# Patient Record
Sex: Female | Born: 1995 | Hispanic: Yes | Marital: Single | Smoking: Never smoker
Health system: Southern US, Community
[De-identification: ages and names within clinical notes are randomized; demographics above are authoritative.]

---

## 2021-05-26 ENCOUNTER — Encounter: Payer: Self-pay | Admitting: *Deleted

## 2021-05-26 DIAGNOSIS — Z20822 Contact with and (suspected) exposure to covid-19: Secondary | ICD-10-CM | POA: Diagnosis present

## 2021-05-26 DIAGNOSIS — Z23 Encounter for immunization: Secondary | ICD-10-CM

## 2021-05-26 DIAGNOSIS — L03114 Cellulitis of left upper limb: Principal | ICD-10-CM | POA: Diagnosis present

## 2021-05-26 DIAGNOSIS — M65132 Other infective (teno)synovitis, left wrist: Secondary | ICD-10-CM | POA: Diagnosis present

## 2021-05-26 DIAGNOSIS — D649 Anemia, unspecified: Secondary | ICD-10-CM | POA: Diagnosis present

## 2021-05-26 DIAGNOSIS — S61531A Puncture wound without foreign body of right wrist, initial encounter: Secondary | ICD-10-CM | POA: Diagnosis present

## 2021-05-26 DIAGNOSIS — S61552A Open bite of left wrist, initial encounter: Secondary | ICD-10-CM | POA: Diagnosis present

## 2021-05-26 DIAGNOSIS — E876 Hypokalemia: Secondary | ICD-10-CM | POA: Diagnosis present

## 2021-05-26 DIAGNOSIS — I959 Hypotension, unspecified: Secondary | ICD-10-CM | POA: Diagnosis present

## 2021-05-26 DIAGNOSIS — W5501XA Bitten by cat, initial encounter: Secondary | ICD-10-CM

## 2021-05-26 NOTE — ED Triage Notes (Signed)
Pt to ED reporting she was bitten by an unknown cat on Friday and is now having swelling and redness to the left hand. Pain with movement also reported.

## 2021-05-26 NOTE — ED Notes (Signed)
Redness on arm marked by this RN.

## 2021-05-27 ENCOUNTER — Other Ambulatory Visit: Payer: Self-pay

## 2021-05-27 ENCOUNTER — Inpatient Hospital Stay
Admission: EM | Admit: 2021-05-27 | Discharge: 2021-05-31 | DRG: 603 | Disposition: A | Discharge status: Home / Self Care | Payer: Self-pay | Attending: Internal Medicine | Admitting: Internal Medicine

## 2021-05-27 DIAGNOSIS — L03119 Cellulitis of unspecified part of limb: Secondary | ICD-10-CM

## 2021-05-27 DIAGNOSIS — L03114 Cellulitis of left upper limb: Secondary | ICD-10-CM | POA: Diagnosis present

## 2021-05-27 DIAGNOSIS — I959 Hypotension, unspecified: Secondary | ICD-10-CM

## 2021-05-27 DIAGNOSIS — M659 Synovitis and tenosynovitis, unspecified: Secondary | ICD-10-CM | POA: Diagnosis present

## 2021-05-27 DIAGNOSIS — M65932 Unspecified synovitis and tenosynovitis, left forearm: Secondary | ICD-10-CM | POA: Diagnosis present

## 2021-05-27 DIAGNOSIS — W5501XA Bitten by cat, initial encounter: Principal | ICD-10-CM

## 2021-05-27 DIAGNOSIS — S61452A Open bite of left hand, initial encounter: Secondary | ICD-10-CM | POA: Diagnosis present

## 2021-05-27 DIAGNOSIS — E876 Hypokalemia: Secondary | ICD-10-CM

## 2021-05-27 LAB — BASIC METABOLIC PANEL
Anion gap: 14 (ref 5–15)
BUN: 11 mg/dL (ref 6–20)
CO2: 25 mmol/L (ref 22–32)
Calcium: 8.6 mg/dL — ABNORMAL LOW (ref 8.9–10.3)
Chloride: 100 mmol/L (ref 98–111)
Creatinine, Ser: 0.47 mg/dL (ref 0.44–1.00)
GFR, Estimated: >60 mL/min (ref >60)
Glucose, Bld: 124 mg/dL — ABNORMAL HIGH (ref 70–99)
Potassium: 3 mmol/L — ABNORMAL LOW (ref 3.5–5.1)
Sodium: 139 mmol/L (ref 135–145)

## 2021-05-27 LAB — RESP PANEL BY RT-PCR (FLU A&B, COVID) ARPGX2
Influenza A by PCR: NEGATIVE
Influenza B by PCR: NEGATIVE
SARS Coronavirus 2 by RT PCR: NEGATIVE

## 2021-05-27 LAB — CBC WITH DIFFERENTIAL/PLATELET
Abs Immature Granulocytes: 0.02 10*3/uL (ref 0.00–0.07)
Basophils Absolute: 0 10*3/uL (ref 0.0–0.1)
Basophils Relative: 0 %
Eosinophils Absolute: 0.1 10*3/uL (ref 0.0–0.5)
Eosinophils Relative: 1 %
HCT: 31.7 % — ABNORMAL LOW (ref 36.0–46.0)
Hemoglobin: 11 g/dL — ABNORMAL LOW (ref 12.0–15.0)
Immature Granulocytes: 0 %
Lymphocytes Relative: 18 %
Lymphs Abs: 1.7 10*3/uL (ref 0.7–4.0)
MCH: 33.3 pg (ref 26.0–34.0)
MCHC: 34.7 g/dL (ref 30.0–36.0)
MCV: 96.1 fL (ref 80.0–100.0)
Monocytes Absolute: 0.5 10*3/uL (ref 0.1–1.0)
Monocytes Relative: 5 %
Neutro Abs: 7.2 10*3/uL (ref 1.7–7.7)
Neutrophils Relative %: 76 %
Platelets: 241 10*3/uL (ref 150–400)
RBC: 3.3 MIL/uL — ABNORMAL LOW (ref 3.87–5.11)
RDW: 13.2 % (ref 11.5–15.5)
WBC: 9.5 10*3/uL (ref 4.0–10.5)
nRBC: 0 % (ref 0.0–0.2)

## 2021-05-27 LAB — LACTIC ACID, PLASMA
Lactic Acid, Venous: 1.3 mmol/L (ref 0.5–1.9)
Lactic Acid, Venous: 1.1 mmol/L (ref 0.5–1.9)

## 2021-05-27 LAB — HIV ANTIBODY (ROUTINE TESTING W REFLEX): HIV Screen 4th Generation wRfx: NONREACTIVE

## 2021-05-27 LAB — HCG, QUANTITATIVE, PREGNANCY: hCG, Beta Chain, Quant, S: <1 m[IU]/mL (ref <5)

## 2021-05-27 LAB — MAGNESIUM: Magnesium: 2.1 mg/dL (ref 1.7–2.4)

## 2021-05-27 MED ORDER — POTASSIUM CHLORIDE CRYS ER 20 MEQ PO TBCR
40.0000 meq | EXTENDED_RELEASE_TABLET | Freq: Once | ORAL | Status: AC
  Administered 2021-05-27: 40 meq via ORAL
  Filled 2021-05-27: qty 2

## 2021-05-27 MED ORDER — TRAZODONE HCL 50 MG PO TABS
25.0000 mg | ORAL_TABLET | Freq: Every evening | ORAL | Status: DC | PRN
  Administered 2021-05-28 – 2021-05-30 (×3): 25 mg via ORAL
  Filled 2021-05-27 (×3): qty 1

## 2021-05-27 MED ORDER — VANCOMYCIN HCL 500 MG/100ML IV SOLN
500.0000 mg | Freq: Once | INTRAVENOUS | Status: AC
  Administered 2021-05-27: 500 mg via INTRAVENOUS
  Filled 2021-05-27: qty 100

## 2021-05-27 MED ORDER — SODIUM CHLORIDE 0.9 % IV SOLN: INTRAVENOUS | Status: DC

## 2021-05-27 MED ORDER — TETANUS-DIPHTH-ACELL PERTUSSIS 5-2.5-18.5 LF-MCG/0.5 IM SUSY
0.5000 mL | PREFILLED_SYRINGE | Freq: Once | INTRAMUSCULAR | Status: AC
  Administered 2021-05-27: 0.5 mL via INTRAMUSCULAR
  Filled 2021-05-27: qty 0.5

## 2021-05-27 MED ORDER — POTASSIUM CHLORIDE CRYS ER 20 MEQ PO TBCR: 40.0000 meq | EXTENDED_RELEASE_TABLET | Freq: Once | ORAL | Status: DC

## 2021-05-27 MED ORDER — ONDANSETRON HCL 4 MG PO TABS: 4.0000 mg | ORAL_TABLET | Freq: Four times a day (QID) | ORAL | Status: DC | PRN

## 2021-05-27 MED ORDER — OXYCODONE-ACETAMINOPHEN 5-325 MG PO TABS
1.0000 | ORAL_TABLET | Freq: Four times a day (QID) | ORAL | Status: DC | PRN
  Administered 2021-05-27 – 2021-05-29 (×4): 1 via ORAL
  Filled 2021-05-27 (×4): qty 1

## 2021-05-27 MED ORDER — SODIUM CHLORIDE 0.9 % IV SOLN
3.0000 g | Freq: Once | INTRAVENOUS | Status: AC
  Administered 2021-05-27: 3 g via INTRAVENOUS
  Filled 2021-05-27: qty 8

## 2021-05-27 MED ORDER — VANCOMYCIN HCL IN DEXTROSE 1-5 GM/200ML-% IV SOLN: 1000.0000 mg | Freq: Once | INTRAVENOUS | Status: DC

## 2021-05-27 MED ORDER — FENTANYL CITRATE PF 50 MCG/ML IJ SOSY
50.0000 ug | PREFILLED_SYRINGE | Freq: Once | INTRAMUSCULAR | Status: AC
  Administered 2021-05-27: 50 ug via INTRAVENOUS
  Filled 2021-05-27: qty 1

## 2021-05-27 MED ORDER — ENOXAPARIN SODIUM 40 MG/0.4ML IJ SOSY
40.0000 mg | PREFILLED_SYRINGE | INTRAMUSCULAR | Status: DC
  Administered 2021-05-28 – 2021-05-31 (×4): 40 mg via SUBCUTANEOUS
  Filled 2021-05-27 (×4): qty 0.4

## 2021-05-27 MED ORDER — ONDANSETRON HCL 4 MG/2ML IJ SOLN: 4.0000 mg | Freq: Four times a day (QID) | INTRAMUSCULAR | Status: DC | PRN

## 2021-05-27 MED ORDER — VANCOMYCIN HCL 750 MG/150ML IV SOLN
750.0000 mg | Freq: Two times a day (BID) | INTRAVENOUS | Status: DC
Start: 2021-05-27 — End: 2021-05-28
  Administered 2021-05-27: 22:00:00 750 mg via INTRAVENOUS
  Filled 2021-05-27 (×2): qty 150

## 2021-05-27 MED ORDER — VANCOMYCIN HCL IN DEXTROSE 1-5 GM/200ML-% IV SOLN
1000.0000 mg | Freq: Once | INTRAVENOUS | Status: AC
  Administered 2021-05-27: 1000 mg via INTRAVENOUS
  Filled 2021-05-27: qty 200

## 2021-05-27 MED ORDER — MAGNESIUM HYDROXIDE 400 MG/5ML PO SUSP
30.0000 mL | Freq: Every day | ORAL | Status: DC | PRN
  Filled 2021-05-27: qty 30

## 2021-05-27 MED ORDER — SODIUM CHLORIDE 0.9 % IV SOLN
3.0000 g | Freq: Four times a day (QID) | INTRAVENOUS | Status: DC
  Administered 2021-05-27 – 2021-05-31 (×17): 3 g via INTRAVENOUS
  Filled 2021-05-27 (×2): qty 3
  Filled 2021-05-27: qty 8
  Filled 2021-05-27: qty 3
  Filled 2021-05-27: qty 8
  Filled 2021-05-27 (×2): qty 3
  Filled 2021-05-27: qty 8
  Filled 2021-05-27: qty 3
  Filled 2021-05-27 (×3): qty 8
  Filled 2021-05-27: qty 3
  Filled 2021-05-27 (×2): qty 8
  Filled 2021-05-27 (×5): qty 3
  Filled 2021-05-27 (×2): qty 8

## 2021-05-27 MED ORDER — ACETAMINOPHEN 650 MG RE SUPP: 650.0000 mg | Freq: Four times a day (QID) | RECTAL | Status: DC | PRN

## 2021-05-27 MED ORDER — KETOROLAC TROMETHAMINE 15 MG/ML IJ SOLN
15.0000 mg | Freq: Four times a day (QID) | INTRAMUSCULAR | Status: DC | PRN
  Administered 2021-05-27 – 2021-05-30 (×8): 15 mg via INTRAVENOUS
  Filled 2021-05-27 (×12): qty 1

## 2021-05-27 MED ORDER — MORPHINE SULFATE (PF) 2 MG/ML IV SOLN
1.0000 mg | INTRAVENOUS | Status: DC | PRN
  Administered 2021-05-29: 1 mg via INTRAVENOUS
  Filled 2021-05-27: qty 1

## 2021-05-27 MED ORDER — RABIES IMMUNE GLOBULIN 150 UNIT/ML IM INJ
20.0000 [IU]/kg | INJECTION | Freq: Once | INTRAMUSCULAR | Status: AC
  Administered 2021-05-27: 1350 [IU] via INTRAMUSCULAR
  Filled 2021-05-27: qty 9

## 2021-05-27 MED ORDER — RABIES IMMUNE GLOBULIN 150 UNIT/ML IM INJ
20.0000 [IU]/kg | INJECTION | Freq: Once | INTRAMUSCULAR | Status: DC
  Filled 2021-05-27: qty 9

## 2021-05-27 MED ORDER — ACETAMINOPHEN 325 MG PO TABS
650.0000 mg | ORAL_TABLET | Freq: Four times a day (QID) | ORAL | Status: DC | PRN
  Administered 2021-05-27: 650 mg via ORAL
  Filled 2021-05-27: qty 2

## 2021-05-27 MED ORDER — KETOROLAC TROMETHAMINE 30 MG/ML IJ SOLN
15.0000 mg | Freq: Once | INTRAMUSCULAR | Status: AC
  Administered 2021-05-27: 15 mg via INTRAVENOUS
  Filled 2021-05-27: qty 1

## 2021-05-27 MED ORDER — RABIES VACCINE, PCEC IM SUSR
1.0000 mL | Freq: Once | INTRAMUSCULAR | Status: AC
  Administered 2021-05-27: 1 mL via INTRAMUSCULAR
  Filled 2021-05-27: qty 1

## 2021-05-27 NOTE — Progress Notes (Signed)
Pharmacy Antibiotic Note  Deborah Hurley is a 25 y.o. female admitted on 05/27/2021 with an animal bite. Patient was bitten by a stray cat. Has swelling, redness and pain to left hand and extending to the arm.  Pharmacy has been consulted for vancomycin dosing.  Plan: Vancomycin 1000 mg given, add 500 mg for 1500 mg total loading dose Start vancomycin 750 mg IV q12h Goal AUC 400-550 Expected AUC: 427 SCr used: 0.8   Height: 5\' 1"  (154.9 cm) Weight: 68 kg (150 lb) IBW/kg (Calculated) : 47.8  Temp (24hrs), Avg:99.3 F (37.4 C), Min:99.1 F (37.3 C), Max:99.4 F (37.4 C)  Recent Labs  Lab 05/27/21 0323 05/27/21 0535  WBC 9.5  --   CREATININE 0.47  --   LATICACIDVEN 1.3 1.1    Estimated Creatinine Clearance: 94.9 mL/min (by C-G formula based on SCr of 0.47 mg/dL).    No Known Allergies  Antimicrobials this admission: Vancomycin 10/24 >> Unasyn 10/24 >>   Microbiology results: 10/24 BCx: NG pending   Thank you for allowing pharmacy to be a part of this patient's care.  11/24, PharmD, BCPS Clinical Pharmacist 05/27/2021 11:25 AM

## 2021-05-27 NOTE — H&P (Signed)
Deborah Hurley   PATIENT NAME: Deborah Hurley    MR#:  [id number]  DATE OF BIRTH:  18-Nov-1995  DATE OF ADMISSION:  05/27/2021  PRIMARY CARE PHYSICIAN: Pcp, No   Patient is coming from: Home  REQUESTING/REFERRING PHYSICIAN: Nita Sickle, MD.  CHIEF COMPLAINT:   Chief Complaint  Patient presents with   Animal Bite    HISTORY OF PRESENT ILLNESS:  Deborah Hurley is a 25 y.o. female with no significant medical history coming with worsening left hand and distal forearm swelling with erythema, tenderness and pain after having a bite by a stray cat on Friday.  She stated that her swelling started on Saturday and since then has been getting significantly worse.  No drainage from her hand or forearm.  No fever or chills.  She denies any chest pain or dyspnea or palpitations.  The cath was apparently not caught. ED Course: When she came to the ER vital signs were within normal except for temperature of 99.1 and later 99.4.  Blood pressure later on was 99/57.  Labs were remarkable for hypokalemia of 3 and a calcium of 8.6, hemoglobin of 11 hematocrit 31.7.  Influenza antigens and COVID-19 PCR came back negative.  Serum pregnancy test came back negative.  Blood cultures were drawn.  Patient was given 50 mcg of IV fentanyl, 40 mEq p.o. potassium chloride, IV Unasyn and vancomycin, 50 mg of IV Toradol, rabies vaccine as well as rabies immunoglobulin.  She will be admitted to a medical bed for further evaluation and management. PAST MEDICAL HISTORY:   She denies any previous medical problems.  PAST SURGICAL HISTORY:   She denies any previous surgeries.  SOCIAL HISTORY:   Social History   Tobacco Use   Smoking status: Never   Smokeless tobacco: Never  Substance Use Topics   Alcohol use: Not on file  She drinks alcohol occasionally but denies any history of tobacco abuse or other illicit drug use.  FAMILY HISTORY:  She denies any familial diseases.  DRUG ALLERGIES:  No  Known Allergies  REVIEW OF SYSTEMS:   ROS As per history of present illness. All pertinent systems were reviewed above. Constitutional, HEENT, cardiovascular, respiratory, GI, GU, musculoskeletal, neuro, psychiatric, endocrine, integumentary and hematologic systems were reviewed and are otherwise negative/unremarkable except for positive findings mentioned above in the HPI.   MEDICATIONS AT HOME:   Prior to Admission medications   Not on File      VITAL SIGNS:  Blood pressure 101/60, pulse 61, temperature 99.4 F (37.4 C), temperature source Oral, resp. rate 18, height 5\' 1"  (1.549 m), weight 68 kg, last menstrual period 05/26/2021, SpO2 99 %.  PHYSICAL EXAMINATION:  Physical Exam  GENERAL:  25 y.o.-year-old female patient lying in the bed with no acute distress.  EYES: Pupils equal, round, reactive to light and accommodation. No scleral icterus. Extraocular muscles intact.  HEENT: Head atraumatic, normocephalic. Oropharynx and nasopharynx clear.  NECK:  Supple, no jugular venous distention. No thyroid enlargement, no tenderness.  LUNGS: Normal breath sounds bilaterally, no wheezing, rales,rhonchi or crepitation. No use of accessory muscles of respiration.  CARDIOVASCULAR: Regular rate and rhythm, S1, S2 normal. No murmurs, rubs, or gallops.  ABDOMEN: Soft, nondistended, nontender. Bowel sounds present. No organomegaly or mass.  EXTREMITIES: No pedal edema, cyanosis, or clubbing.  NEUROLOGIC: Cranial nerves II through XII are intact. Muscle strength 5/5 in all extremities. Sensation intact. Gait not checked.  PSYCHIATRIC: The patient is alert and oriented x 3.  Normal  affect and good eye contact. SKIN: Diffuse erythema and swelling of the left hand and distal forearm with no fluctuation however with associated diffuse tenderness.   LABORATORY PANEL:   CBC Recent Labs  Lab 05/27/21 0323  WBC 9.5  HGB 11.0*  HCT 31.7*  PLT 241    ------------------------------------------------------------------------------------------------------------------  Chemistries  Recent Labs  Lab 05/27/21 0323  NA 139  K 3.0*  CL 100  CO2 25  GLUCOSE 124*  BUN 11  CREATININE 0.47  CALCIUM 8.6*  MG 2.1   ------------------------------------------------------------------------------------------------------------------  Cardiac Enzymes No results for input(s): TROPONINI in the last 168 hours. ------------------------------------------------------------------------------------------------------------------  RADIOLOGY:  No results found.    IMPRESSION AND PLAN:  Active Problems:   Cellulitis of left hand  1.  Diffuse left hand and distal forearm cellulitis due to infected stray cat bite. - The patient will be admitted to a medical bed. - Continue antibiotic therapy with IV Unasyn to cover anaerobes and IV vancomycin given the severity of the cellulitis. - Warm compresses will be given - We will obtain general surgery consult for follow-up. - Pain management will be provided. - We will follow blood cultures. - The patient received rabies vaccine and immunoglobulin.  He will need to complete series for rabies vaccine.  Follow-up can be given to the patient with [facility] urgent care or at the local health department ER.  2.  Hypokalemia. - Potassium will be replaced and magnesium level will be checked.  3.  Hypotension. - The patient has been responding to IV fluids and BP will be closely monitored.   DVT prophylaxis: Lovenox. Code Status: full code. Family Communication:  The plan of care was discussed in details with the patient (and family). I answered all questions. The patient agreed to proceed with the above mentioned plan. Further management will depend upon hospital course. Disposition Plan: Back to previous home environment Consults called: General surgery. All the records are reviewed and case discussed  with ED provider.  Status is: Inpatient  Remains inpatient appropriate because:Ongoing diagnostic testing needed not appropriate for outpatient work up, Unsafe d/c plan, IV treatments appropriate due to intensity of illness or inability to take PO, and Inpatient level of care appropriate due to severity of illness   Dispo: The patient is from: Home              Anticipated d/c is to: Home              Patient currently is not medically stable to d/c.              Difficult to place patient: No  TOTAL TIME TAKING CARE OF THIS PATIENT: 50 minutes.     Hannah Beat M.D on 05/27/2021 at 7:27 AM  [facility]   From 7 PM-7 AM, contact night-coverage [email or web address]  CC: Primary care physician; Pcp, No

## 2021-05-27 NOTE — Progress Notes (Signed)
PROGRESS NOTE    Deborah Hurley  ATF:[id number] DOB: 09-16-1995 DOA: 05/27/2021 PCP: Pcp, No   Assessment & Plan:   Active Problems:   Cellulitis of left hand   Cellulitis of left hand & forearm: due to infected stray cat bite. S/p rabies vaccine & immunoglobulin given in the ER and pt will need to complete rabies vaccine series. Continue on IV unasyn, vanco. Blood cxs NGTD.  Ortho surg consulted   Hypokalemia: KCl repleated. Will continue to monitor   Hypotension: resolved    DVT prophylaxis: lovenox Code Status: full  Family Communication:  Disposition Plan: likely d/c back home   Level of care: Med-Surg  Status is: Inpatient  Remains inpatient appropriate because: severity of illness    Consultants:  Ortho surg   Procedures:   Antimicrobials: vanco, unasyn   Subjective: Pt c/o left hand pain & swelling   Objective: Vitals:   05/27/21 0400 05/27/21 0544 05/27/21 0600 05/27/21 0744  BP: 118/76 (!) 99/57 101/60 104/63  Pulse: 88 77 61 60  Resp: 18 16 18 16   Temp:      TempSrc:      SpO2: 99% 100% 99% 96%  Weight:      Height:       No intake or output data in the 24 hours ending 05/27/21 0841 Filed Weights   05/26/21 2106  Weight: 68 kg    Examination:  General exam: Appears calm and comfortable  Respiratory system: Clear to auscultation. Respiratory effort normal. Cardiovascular system: S1 & S2 +. No rubs, gallops or clicks. No pedal edema. Gastrointestinal system: Abdomen is nondistended, soft and nontender. Normal bowel sounds heard. Central nervous system: Alert and oriented Extremities: left hand and arm are edematous, erythematous, & tender to palpation Psychiatry: Judgement and insight appear normal. Mood & affect appropriate.     Data Reviewed: I have personally reviewed following labs and imaging studies  CBC: Recent Labs  Lab 05/27/21 0323  WBC 9.5  NEUTROABS 7.2  HGB 11.0*  HCT 31.7*  MCV 96.1  PLT 241   Basic  Metabolic Panel: Recent Labs  Lab 05/27/21 0323  NA 139  K 3.0*  CL 100  CO2 25  GLUCOSE 124*  BUN 11  CREATININE 0.47  CALCIUM 8.6*  MG 2.1   GFR: Estimated Creatinine Clearance: 94.9 mL/min (by C-G formula based on SCr of 0.47 mg/dL). Liver Function Tests: No results for input(s): AST, ALT, ALKPHOS, BILITOT, PROT, ALBUMIN in the last 168 hours. No results for input(s): LIPASE, AMYLASE in the last 168 hours. No results for input(s): AMMONIA in the last 168 hours. Coagulation Profile: No results for input(s): INR, PROTIME in the last 168 hours. Cardiac Enzymes: No results for input(s): CKTOTAL, CKMB, CKMBINDEX, TROPONINI in the last 168 hours. BNP (last 3 results) No results for input(s): PROBNP in the last 8760 hours. HbA1C: No results for input(s): HGBA1C in the last 72 hours. CBG: No results for input(s): GLUCAP in the last 168 hours. Lipid Profile: No results for input(s): CHOL, HDL, LDLCALC, TRIG, CHOLHDL, LDLDIRECT in the last 72 hours. Thyroid Function Tests: No results for input(s): TSH, T4TOTAL, FREET4, T3FREE, THYROIDAB in the last 72 hours. Anemia Panel: No results for input(s): VITAMINB12, FOLATE, FERRITIN, TIBC, IRON, RETICCTPCT in the last 72 hours. Sepsis Labs: Recent Labs  Lab 05/27/21 0323 05/27/21 0535  LATICACIDVEN 1.3 1.1    Recent Results (from the past 240 hour(s))  Blood culture (routine x 2)     Status: None (Preliminary result)  Collection Time: 05/27/21  3:13 AM   Specimen: BLOOD  Result Value Ref Range Status   Specimen Description BLOOD RIGHT ANTECUBITAL  Final   Special Requests   Final    BOTTLES DRAWN AEROBIC AND ANAEROBIC Blood Culture adequate volume   Culture   Final    NO GROWTH < 12 HOURS Performed at [facility], [address], [place], [place] [address]    Report Status PENDING  Incomplete  Blood culture (routine x 2)     Status: None (Preliminary result)   Collection Time: 05/27/21  3:19 AM   Specimen:  BLOOD  Result Value Ref Range Status   Specimen Description BLOOD BLOOD RIGHT HAND  Final   Special Requests   Final    BOTTLES DRAWN AEROBIC AND ANAEROBIC Blood Culture adequate volume   Culture   Final    NO GROWTH < 12 HOURS Performed at [facility], [address], [place], [place] [address]    Report Status PENDING  Incomplete  Resp Panel by RT-PCR (Flu A&B, Covid) Nasopharyngeal Swab     Status: None   Collection Time: 05/27/21  3:45 AM   Specimen: Nasopharyngeal Swab; Nasopharyngeal(NP) swabs in vial transport medium  Result Value Ref Range Status   SARS Coronavirus 2 by RT PCR NEGATIVE NEGATIVE Final    Comment: (NOTE) SARS-CoV-2 target nucleic acids are NOT DETECTED.  The SARS-CoV-2 RNA is generally detectable in upper respiratory specimens during the acute phase of infection. The lowest concentration of SARS-CoV-2 viral copies this assay can detect is 138 copies/mL. A negative result does not preclude SARS-Cov-2 infection and should not be used as the sole basis for treatment or other patient management decisions. A negative result may occur with  improper specimen collection/handling, submission of specimen other than nasopharyngeal swab, presence of viral mutation(s) within the areas targeted by this assay, and inadequate number of viral copies(<138 copies/mL). A negative result must be combined with clinical observations, patient history, and epidemiological information. The expected result is Negative.  Fact Sheet for Patients:  BloggerCourse.com  Fact Sheet for Healthcare Providers:  SeriousBroker.it  This test is no t yet approved or cleared by the [place] FDA and  has been authorized for detection and/or diagnosis of SARS-CoV-2 by FDA under an Emergency Use Authorization (EUA). This EUA will remain  in effect (meaning this test can be used) for the duration of the COVID-19 declaration under  Section 564(b)(1) of the Act, 21 U.S.C.section 360bbb-3(b)(1), unless the authorization is terminated  or revoked sooner.       Influenza A by PCR NEGATIVE NEGATIVE Final   Influenza B by PCR NEGATIVE NEGATIVE Final    Comment: (NOTE) The Xpert Xpress SARS-CoV-2/FLU/RSV plus assay is intended as an aid in the diagnosis of influenza from Nasopharyngeal swab specimens and should not be used as a sole basis for treatment. Nasal washings and aspirates are unacceptable for Xpert Xpress SARS-CoV-2/FLU/RSV testing.  Fact Sheet for Patients: BloggerCourse.com  Fact Sheet for Healthcare Providers: SeriousBroker.it  This test is not yet approved or cleared by the [place] FDA and has been authorized for detection and/or diagnosis of SARS-CoV-2 by FDA under an Emergency Use Authorization (EUA). This EUA will remain in effect (meaning this test can be used) for the duration of the COVID-19 declaration under Section 564(b)(1) of the Act, 21 U.S.C. section 360bbb-3(b)(1), unless the authorization is terminated or revoked.  Performed at [facility], [address] [place], [place] [address]  Radiology Studies: No results found.      Scheduled Meds:  enoxaparin (LOVENOX) injection  40 mg Subcutaneous Q24H   potassium chloride  40 mEq Oral Once   potassium chloride  40 mEq Oral Once   Continuous Infusions:  sodium chloride 100 mL/hr at 05/27/21 0617   ampicillin-sulbactam (UNASYN) IV 3 g (05/27/21 0809)   vancomycin     vancomycin       LOS: 0 days    Time spent: 32 mins     Charise Killian, MD [facility] Pager [phone]  If 7PM-7AM, please contact night-coverage 05/27/2021, 8:41 AM

## 2021-05-27 NOTE — ED Notes (Signed)
Not enough blood return to completely fill blood culture bottles.

## 2021-05-27 NOTE — ED Notes (Signed)
Pt ambulated to toilet. Then back to bed.  

## 2021-05-27 NOTE — ED Notes (Signed)
RN to bedside to introduce self to pt. Pt awake and in no distress. Pt asking for pain medication.

## 2021-05-27 NOTE — ED Notes (Signed)
Informed RN bed assigned 

## 2021-05-27 NOTE — ED Notes (Signed)
Pt ambulated to toilet and back.  

## 2021-05-27 NOTE — ED Provider Notes (Signed)
[facility] Emergency Department Provider Note  ____________________________________________  Time seen: Approximately 3:01 AM  I have reviewed the triage vital signs and the nursing notes.   HISTORY  Chief Complaint Animal Bite   HPI Deborah Hurley is a 25 y.o. female no significant past medical history who presents for evaluation of left hand pain.  Patient reports that she was bitten by a stray cat 2 days ago.  Today started having swelling, redness, and pain of the left hand that is extending to the arm.  Denies fever or chills.  She is complaining of severe sharp pain worse with movement of the hand has been constant since earlier today.  Unknown last tetanus shot.  No prior rabies immunization.   History reviewed. No pertinent past medical history.  There are no problems to display for this patient.   History reviewed. No pertinent surgical history.  Prior to Admission medications   Not on File    Allergies Patient has no allergy information on record.  History reviewed. No pertinent family history.  Social History Social History   Tobacco Use   Smoking status: Never   Smokeless tobacco: Never    Review of Systems  Constitutional: Negative for fever. Eyes: Negative for visual changes. ENT: Negative for sore throat. Neck: No neck pain  Cardiovascular: Negative for chest pain. Respiratory: Negative for shortness of breath. Gastrointestinal: Negative for abdominal pain, vomiting or diarrhea. Genitourinary: Negative for dysuria. Musculoskeletal: Negative for back pain. + L hand pain and swelling Skin: Negative for rash. Neurological: Negative for headaches, weakness or numbness. Psych: No SI or HI  ____________________________________________   PHYSICAL EXAM:  VITAL SIGNS: ED Triage Vitals  Enc Vitals Group     BP 05/26/21 2106 116/70     Pulse Rate 05/26/21 2106 88     Resp 05/26/21 2106 18     Temp 05/26/21 2106 99.1  F (37.3 C)     Temp Source 05/26/21 2106 Oral     SpO2 05/26/21 2106 100 %     Weight 05/26/21 2106 150 lb (68 kg)     Height 05/26/21 2106 5\' 1"  (1.549 m)     Head Circumference --      Peak Flow --      Pain Score 05/26/21 2110 9     Pain Loc --      Pain Edu? --      Excl. in GC? --     Constitutional: Alert and oriented. Well appearing and in no apparent distress. HEENT:      Head: Normocephalic and atraumatic.         Eyes: Conjunctivae are normal. Sclera is non-icteric.       Mouth/Throat: Mucous membranes are moist.       Neck: Supple with no signs of meningismus. Cardiovascular: Regular rate and rhythm. No murmurs, gallops, or rubs. 2+ symmetrical distal pulses are present in all extremities. No JVD. Respiratory: Normal respiratory effort. Lungs are clear to auscultation bilaterally.  Gastrointestinal: Soft, non tender, and non distended with positive bowel sounds. No rebound or guarding. Musculoskeletal: There are several shallow healing scratches that look like from cat bite.  The left hand has circumferential swelling, erythema, warmth, diffuse tenderness extending all the way to distal forearm Neurologic: Normal speech and language. Face is symmetric. Moving all extremities. No gross focal neurologic deficits are appreciated. Skin: Skin is warm, dry and intact. No rash noted. Psychiatric: Mood and affect are normal. Speech and behavior are normal.  ____________________________________________   LABS (all labs ordered are listed, but only abnormal results are displayed)  Labs Reviewed  CBC WITH DIFFERENTIAL/PLATELET - Abnormal; Notable for the following components:      Result Value   RBC 3.30 (*)    Hemoglobin 11.0 (*)    HCT 31.7 (*)    All other components within normal limits  BASIC METABOLIC PANEL - Abnormal; Notable for the following components:   Potassium 3.0 (*)    Glucose, Bld 124 (*)    Calcium 8.6 (*)    All other components within normal limits   CULTURE, BLOOD (ROUTINE X 2)  CULTURE, BLOOD (ROUTINE X 2)  RESP PANEL BY RT-PCR (FLU A&B, COVID) ARPGX2  LACTIC ACID, PLASMA  LACTIC ACID, PLASMA  HCG, QUANTITATIVE, PREGNANCY   ____________________________________________  EKG  none  ____________________________________________  RADIOLOGY  none   ____________________________________________   PROCEDURES  Procedure(s) performed: None Procedures   Critical Care performed:  None ____________________________________________   INITIAL IMPRESSION / ASSESSMENT AND PLAN / ED COURSE  25 y.o. female no significant past medical history who presents for evaluation of left hand pain and swelling after being bitten by a stray cat 3 days ago.  Patient with severe circumferential swelling, erythema and warmth of the left hand and wrist extending to the distal left forearm with several healing scratches/cat bite wounds.  Unknown last tetanus shot, no prior rabies immunization.  Will initiate IV Unasyn and vancomycin, will get blood work, will give tetanus booster, rabies vaccine and immunoglobulin's.  Anticipate admission to the hospitalist service for IV antibiotics and close monitoring.  _________________________ 3:53 AM on 05/27/2021 ----------------------------------------- Labs showing no signs of sepsis.  We will discussed with the hospitalist for admission.     _____________________________________________ Please note:  Patient was evaluated in Emergency Department today for the symptoms described in the history of present illness. Patient was evaluated in the context of the global COVID-19 pandemic, which necessitated consideration that the patient might be at risk for infection with the SARS-CoV-2 virus that causes COVID-19. Institutional protocols and algorithms that pertain to the evaluation of patients at risk for COVID-19 are in a state of rapid change based on information released by regulatory bodies including the CDC and  federal and state organizations. These policies and algorithms were followed during the patient's care in the ED.  Some ED evaluations and interventions may be delayed as a result of limited staffing during the pandemic.   [place] Controlled Substance Database was reviewed by me. ____________________________________________   FINAL CLINICAL IMPRESSION(S) / ED DIAGNOSES   Final diagnoses:  Cat bite of left hand, initial encounter  Cellulitis of hand      NEW MEDICATIONS STARTED DURING THIS VISIT:  ED Discharge Orders     None        Note:  This document was prepared using Dragon voice recognition software and may include unintentional dictation errors.    Nita Sickle, MD 05/27/21 [phone]

## 2021-05-27 NOTE — Plan of Care (Signed)
Education started 

## 2021-05-27 NOTE — Consult Note (Signed)
ORTHOPAEDIC CONSULTATION  REQUESTING PHYSICIAN: Charise Killian, MD  Chief Complaint:   Left hand pain and swelling  History of Present Illness: Deborah Hurley is a 25 y.o. female who is right-handed and was bitten by a stray cat on 05/24/2021.  She noted 4 puncture wounds along the radial aspect of her distal forearm just proximal to the wrist.  She has had increased pain and swelling over the weekend and presented to the emergency department given these findings.  She was started on IV antibiotics consisting of Unasyn and vancomycin and was given Appropriate treatment for rabies.  She denies any drainage, fevers, chills.  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  No significant medical history.  History reviewed. No pertinent past medical history. History reviewed. No pertinent surgical history. Social History   Socioeconomic History   Marital status: Single    Spouse name: Not on file   Number of children: Not on file   Years of education: Not on file   Highest education level: Not on file  Occupational History   Not on file  Tobacco Use   Smoking status: Never   Smokeless tobacco: Never  Substance and Sexual Activity   Alcohol use: Not on file   Drug use: Not on file   Sexual activity: Not on file  Other Topics Concern   Not on file  Social History Narrative   Not on file   Social Determinants of Health   Financial Resource Strain: Not on file  Food Insecurity: Not on file  Transportation Needs: Not on file  Physical Activity: Not on file  Stress: Not on file  Social Connections: Not on file   History reviewed. No pertinent family history. No Known Allergies Prior to Admission medications   Not on File   Recent Labs    05/27/21 0323  WBC 9.5  HGB 11.0*  HCT 31.7*  PLT 241  K 3.0*  CL 100  CO2 25  BUN 11  CREATININE 0.47  GLUCOSE 124*  CALCIUM 8.6*   No results  found.   Positive ROS: All other systems have been reviewed and were otherwise negative with the exception of those mentioned in the HPI and as above.  Physical Exam: BP (!) 100/59   Pulse 65   Temp 99.4 F (37.4 C) (Oral)   Resp 16   Ht 5\' 1"  (1.549 m)   Wt 68 kg   LMP 05/26/2021 (Exact Date)   SpO2 98%   BMI 28.34 kg/m  General:  Alert, no acute distress Psychiatric:  Patient is competent for consent with normal mood and affect   Cardiovascular:  No pedal edema, regular rate and rhythm Respiratory:  No wheezing, non-labored breathing GI:  Abdomen is soft and non-tender Skin:  No lesions in the area of chief complaint, no erythema Neurologic:  Sensation intact distally, CN grossly intact Lymphatic:  No axillary or cervical lymphadenopathy  Orthopedic Exam:  LUE: +ain/pin/u motor.  Patient has active and passive range of motion of the wrist and fingers with moderate pain. SILT r/u/m/ax +rad pulse Significant swelling about the dorsum of the hand.  There is significant erythema about the dorsum of the hand and wrist with extension to the distal forearm.   There is no palpable fluid collection. There is mild-moderate tenderness to palpation, most significant in the region of the puncture wounds.  No active drainage from these sites.   X-rays:  None obtained   Assessment/Plan: 25 year old female with soft tissue swelling, erythema,  and pain after cat bite consistent with cellulitis.  There is not appear to be any focal fluid collection or other condition amenable to surgical treatment at this time. 1.  Recommend continued IV antibiotics including Unasyn to cover for organisms transmitted via cat bite.  Can continue vancomycin as well. 2.  Keep hand elevated and immobilized as much as possible. 3.  We will plan to reevaluate over the next few days.  Can consider MRI of the hand if no significant improvement or worsening of symptoms.    Signa Kell   05/27/2021 11:23 AM

## 2021-05-28 DIAGNOSIS — S61452A Open bite of left hand, initial encounter: Secondary | ICD-10-CM | POA: Diagnosis present

## 2021-05-28 DIAGNOSIS — W5501XA Bitten by cat, initial encounter: Secondary | ICD-10-CM

## 2021-05-28 LAB — CBC
HCT: 29.3 % — ABNORMAL LOW (ref 36.0–46.0)
Hemoglobin: 9.7 g/dL — ABNORMAL LOW (ref 12.0–15.0)
MCH: 31.6 pg (ref 26.0–34.0)
MCHC: 33.1 g/dL (ref 30.0–36.0)
MCV: 95.4 fL (ref 80.0–100.0)
Platelets: 203 10*3/uL (ref 150–400)
RBC: 3.07 MIL/uL — ABNORMAL LOW (ref 3.87–5.11)
RDW: 13.2 % (ref 11.5–15.5)
WBC: 6.8 10*3/uL (ref 4.0–10.5)
nRBC: 0 % (ref 0.0–0.2)

## 2021-05-28 LAB — BASIC METABOLIC PANEL
Anion gap: 6 (ref 5–15)
BUN: 7 mg/dL (ref 6–20)
CO2: 22 mmol/L (ref 22–32)
Calcium: 8.4 mg/dL — ABNORMAL LOW (ref 8.9–10.3)
Chloride: 108 mmol/L (ref 98–111)
Creatinine, Ser: 0.44 mg/dL (ref 0.44–1.00)
GFR, Estimated: >60 mL/min (ref >60)
Glucose, Bld: 95 mg/dL (ref 70–99)
Potassium: 4 mmol/L (ref 3.5–5.1)
Sodium: 136 mmol/L (ref 135–145)

## 2021-05-28 LAB — MAGNESIUM: Magnesium: 2 mg/dL (ref 1.7–2.4)

## 2021-05-28 MED ORDER — VANCOMYCIN HCL IN DEXTROSE 1-5 GM/200ML-% IV SOLN
1000.0000 mg | Freq: Two times a day (BID) | INTRAVENOUS | Status: DC
Start: 2021-05-28 — End: 2021-05-29
  Administered 2021-05-28 – 2021-05-29 (×3): 1000 mg via INTRAVENOUS
  Filled 2021-05-28 (×5): qty 200

## 2021-05-28 NOTE — Progress Notes (Signed)
Pharmacy Antibiotic Note  Deborah Hurley is a 25 y.o. female admitted on 05/27/2021 with an animal bite. Patient was bitten by a stray cat. Has swelling, redness and pain to left hand and extending to the arm.  Pharmacy has been consulted for vancomycin dosing. Pt received 1500 mg total loading dose.   Plan: Change vancomycin 750 mg IV q12h to 1000 mg IV q12h Goal AUC 400-550 Expected AUC: 448 SCr used: 0.8; Vd 0.72, IBW   Height: 5\' 1"  (154.9 cm) Weight: 68 kg (150 lb) IBW/kg (Calculated) : 47.8  Temp (24hrs), Avg:98.2 F (36.8 C), Min:97.7 F (36.5 C), Max:98.8 F (37.1 C)  Recent Labs  Lab 05/27/21 0323 05/27/21 0535 05/28/21 0607  WBC 9.5  --  6.8  CREATININE 0.47  --  0.44  LATICACIDVEN 1.3 1.1  --      Estimated Creatinine Clearance: 94.9 mL/min (by C-G formula based on SCr of 0.44 mg/dL).    No Known Allergies  Antimicrobials this admission: Vancomycin 10/24 >> Unasyn 10/24 >>   Microbiology results: 10/24 BCx: NG<12h   Thank you for allowing pharmacy to be a part of this patient's care.  11/24, PharmD Clinical Pharmacist 05/28/2021 7:20 AM

## 2021-05-28 NOTE — Progress Notes (Signed)
Patient states that her hand pain may be slightly improved, but it is still significantly painful.  She has been keeping her hand elevated most of the time.  LUE: +ain/pin/u motor.  Patient has active and passive range of motion of the wrist and fingers.  Exam improved with more passive motion compared to that on 05/27/2021 SILT r/u/m/ax +rad pulse Significant swelling about the dorsum of the hand. Prior erythema has resolved. There is no palpable fluid collection. There is mild tenderness to palpation, most significant in the region of the puncture wounds.  No active drainage from these sites.     Assessment/Plan: 25 year old female with soft tissue swelling, erythema, and pain after cat bite consistent with cellulitis.  There is not appear to be any focal fluid collection or other condition amenable to surgical treatment at this time. 1.  Recommend continued IV antibiotics including Unasyn to cover for organisms transmitted via cat bite.  Can continue vancomycin as well. 2.  Keep hand elevated and immobilized as much as possible. 3.  We will plan to reevaluate over the next few days.  Can consider MRI of the hand if no significant improvement or worsening of symptoms.

## 2021-05-28 NOTE — Progress Notes (Signed)
PROGRESS NOTE   HPI was taken from Dr. Arville Care: Deborah Hurley is a 25 y.o. female with no significant medical history coming with worsening left hand and distal forearm swelling with erythema, tenderness and pain after having a bite by a stray cat on Friday.  She stated that her swelling started on Saturday and since then has been getting significantly worse.  No drainage from her hand or forearm.  No fever or chills.  She denies any chest pain or dyspnea or palpitations.  The cath was apparently not caught. ED Course: When she came to the ER vital signs were within normal except for temperature of 99.1 and later 99.4.  Blood pressure later on was 99/57.  Labs were remarkable for hypokalemia of 3 and a calcium of 8.6, hemoglobin of 11 hematocrit 31.7.  Influenza antigens and COVID-19 PCR came back negative.  Serum pregnancy test came back negative.  Blood cultures were drawn.   Patient was given 50 mcg of IV fentanyl, 40 mEq p.o. potassium chloride, IV Unasyn and vancomycin, 50 mg of IV Toradol, rabies vaccine as well as rabies immunoglobulin.  She will be admitted to a medical bed for further evaluation and management.   Deborah Hurley  CVE:[id number] DOB: May 25, 1996 DOA: 05/27/2021 PCP: Pcp, No   Assessment & Plan:   Active Problems:   Cellulitis of left hand   Cellulitis of left hand & forearm: due to infected stray cat bite. S/p rabies vaccine & immunoglobulin given in the ER and pt will need to complete rabies vaccine series. Still w/ significant swelling and pain. Continue on IV unasyn, vanco. Blood cxs NGTD. Ortho surg following and recs apprec   Hypokalemia: WNL   Hypotension: resolved    DVT prophylaxis: lovenox Code Status: full  Family Communication:  Disposition Plan: likely d/c back home   Level of care: Med-Surg  Status is: Inpatient  Remains inpatient appropriate because: severity of illness    Consultants:  Ortho surg   Procedures:   Antimicrobials: vanco,  unasyn   Subjective: Pt c/o left hand pain and swelling still   Objective: Vitals:   05/27/21 1613 05/27/21 2014 05/28/21 0003 05/28/21 0529  BP: 95/60 100/66 123/63 (!) 115/59  Pulse: 78 67 76 85  Resp: 20 18 16 16   Temp: 98.1 F (36.7 C) 97.7 F (36.5 C) 97.8 F (36.6 C) 98.7 F (37.1 C)  TempSrc: Oral  Oral   SpO2: 100% 100% 100% 100%  Weight:      Height:        Intake/Output Summary (Last 24 hours) at 05/28/2021 0723 Last data filed at 05/27/2021 1843 Gross per 24 hour  Intake 1240 ml  Output --  Net 1240 ml   Filed Weights   05/26/21 2106  Weight: 68 kg    Examination:  General exam: Appears calm but uncomfortable  Respiratory system: clear breath sounds b/l.  Cardiovascular system: S1/S2+. No rubs or clicks  Gastrointestinal system: Abd is soft, NT, ND & normal bowel sounds  Central nervous system: Alert and oriented. Moves all extremities  Extremities: left hand and arm are edematous, erythematous, & tender to palpation Psychiatry:  judgement and insight appears normal. Flat mood and affect    Data Reviewed: I have personally reviewed following labs and imaging studies  CBC: Recent Labs  Lab 05/27/21 0323 05/28/21 0607  WBC 9.5 6.8  NEUTROABS 7.2  --   HGB 11.0* 9.7*  HCT 31.7* 29.3*  MCV 96.1 95.4  PLT 241 203  Basic Metabolic Panel: Recent Labs  Lab 05/27/21 0323 05/28/21 0607  NA 139 136  K 3.0* 4.0  CL 100 108  CO2 25 22  GLUCOSE 124* 95  BUN 11 7  CREATININE 0.47 0.44  CALCIUM 8.6* 8.4*  MG 2.1 2.0   GFR: Estimated Creatinine Clearance: 94.9 mL/min (by C-G formula based on SCr of 0.44 mg/dL). Liver Function Tests: No results for input(s): AST, ALT, ALKPHOS, BILITOT, PROT, ALBUMIN in the last 168 hours. No results for input(s): LIPASE, AMYLASE in the last 168 hours. No results for input(s): AMMONIA in the last 168 hours. Coagulation Profile: No results for input(s): INR, PROTIME in the last 168 hours. Cardiac Enzymes: No  results for input(s): CKTOTAL, CKMB, CKMBINDEX, TROPONINI in the last 168 hours. BNP (last 3 results) No results for input(s): PROBNP in the last 8760 hours. HbA1C: No results for input(s): HGBA1C in the last 72 hours. CBG: No results for input(s): GLUCAP in the last 168 hours. Lipid Profile: No results for input(s): CHOL, HDL, LDLCALC, TRIG, CHOLHDL, LDLDIRECT in the last 72 hours. Thyroid Function Tests: No results for input(s): TSH, T4TOTAL, FREET4, T3FREE, THYROIDAB in the last 72 hours. Anemia Panel: No results for input(s): VITAMINB12, FOLATE, FERRITIN, TIBC, IRON, RETICCTPCT in the last 72 hours. Sepsis Labs: Recent Labs  Lab 05/27/21 0323 05/27/21 0535  LATICACIDVEN 1.3 1.1    Recent Results (from the past 240 hour(s))  Blood culture (routine x 2)     Status: None (Preliminary result)   Collection Time: 05/27/21  3:13 AM   Specimen: BLOOD  Result Value Ref Range Status   Specimen Description BLOOD RIGHT ANTECUBITAL  Final   Special Requests   Final    BOTTLES DRAWN AEROBIC AND ANAEROBIC Blood Culture adequate volume   Culture   Final    NO GROWTH < 12 HOURS Performed at [facility], [address], [place], [place] [address]    Report Status PENDING  Incomplete  Blood culture (routine x 2)     Status: None (Preliminary result)   Collection Time: 05/27/21  3:19 AM   Specimen: BLOOD  Result Value Ref Range Status   Specimen Description BLOOD BLOOD RIGHT HAND  Final   Special Requests   Final    BOTTLES DRAWN AEROBIC AND ANAEROBIC Blood Culture adequate volume   Culture   Final    NO GROWTH < 12 HOURS Performed at [facility], [address], [place], [place] [address]    Report Status PENDING  Incomplete  Resp Panel by RT-PCR (Flu A&B, Covid) Nasopharyngeal Swab     Status: None   Collection Time: 05/27/21  3:45 AM   Specimen: Nasopharyngeal Swab; Nasopharyngeal(NP) swabs in vial transport medium  Result Value Ref Range Status   SARS  Coronavirus 2 by RT PCR NEGATIVE NEGATIVE Final    Comment: (NOTE) SARS-CoV-2 target nucleic acids are NOT DETECTED.  The SARS-CoV-2 RNA is generally detectable in upper respiratory specimens during the acute phase of infection. The lowest concentration of SARS-CoV-2 viral copies this assay can detect is 138 copies/mL. A negative result does not preclude SARS-Cov-2 infection and should not be used as the sole basis for treatment or other patient management decisions. A negative result may occur with  improper specimen collection/handling, submission of specimen other than nasopharyngeal swab, presence of viral mutation(s) within the areas targeted by this assay, and inadequate number of viral copies(<138 copies/mL). A negative result must be combined with clinical observations, patient history, and epidemiological  information. The expected result is Negative.  Fact Sheet for Patients:  BloggerCourse.com  Fact Sheet for Healthcare Providers:  SeriousBroker.it  This test is no t yet approved or cleared by the [place] FDA and  has been authorized for detection and/or diagnosis of SARS-CoV-2 by FDA under an Emergency Use Authorization (EUA). This EUA will remain  in effect (meaning this test can be used) for the duration of the COVID-19 declaration under Section 564(b)(1) of the Act, 21 U.S.C.section 360bbb-3(b)(1), unless the authorization is terminated  or revoked sooner.       Influenza A by PCR NEGATIVE NEGATIVE Final   Influenza B by PCR NEGATIVE NEGATIVE Final    Comment: (NOTE) The Xpert Xpress SARS-CoV-2/FLU/RSV plus assay is intended as an aid in the diagnosis of influenza from Nasopharyngeal swab specimens and should not be used as a sole basis for treatment. Nasal washings and aspirates are unacceptable for Xpert Xpress SARS-CoV-2/FLU/RSV testing.  Fact Sheet for  Patients: BloggerCourse.com  Fact Sheet for Healthcare Providers: SeriousBroker.it  This test is not yet approved or cleared by the [place] FDA and has been authorized for detection and/or diagnosis of SARS-CoV-2 by FDA under an Emergency Use Authorization (EUA). This EUA will remain in effect (meaning this test can be used) for the duration of the COVID-19 declaration under Section 564(b)(1) of the Act, 21 U.S.C. section 360bbb-3(b)(1), unless the authorization is terminated or revoked.  Performed at [facility], [address] [place], [place] [address]          Radiology Studies: No results found.      Scheduled Meds:  enoxaparin (LOVENOX) injection  40 mg Subcutaneous Q24H   Continuous Infusions:  ampicillin-sulbactam (UNASYN) IV 3 g (05/28/21 0636)   vancomycin       LOS: 1 day    Time spent: 20 mins     Charise Killian, MD [facility] Pager [phone]  If 7PM-7AM, please contact night-coverage 05/28/2021, 7:23 AM

## 2021-05-28 NOTE — TOC Initial Note (Signed)
Transition of Care [facility]) - Initial/Assessment Note    Patient Details  Name: Deborah Hurley MRN: [id number] Date of Birth: 04/18/1996  Transition of [facility]) CM/SW Contact:    Caryn Section, RN Phone Number: 05/28/2021, 4:17 PM  Clinical Narrative:  Patient lives at home, no concerns about transportation or medications.    Please send medications to Medication Management.    PCP resources with application for [facility] clinic given to patient, who is currently reviewing them.  She states she plans to follow up with an appointment.  Patient has no reservations about returning home at discharge.  TOC contact information provided to patient.                   Barriers to Discharge: Continued Medical Work up   Patient Goals and CMS Choice        Expected Discharge Plan and Services     Discharge Planning Services: CM Consult   Living arrangements for the past 2 months: Single Family Home                                      Prior Living Arrangements/Services Living arrangements for the past 2 months: Single Family Home Lives with:: Self Patient language and need for interpreter reviewed:: Yes (No interpreter required.) Do you feel safe going back to the place where you live?: Yes      Need for Family Participation in Patient Care: Yes (Comment) Care giver support system in place?: Yes (comment)   Criminal Activity/Legal Involvement Pertinent to Current Situation/Hospitalization: No - Comment as needed  Activities of Daily Living Home Assistive Devices/Equipment: None ADL Screening (condition at time of admission) Patient's cognitive ability adequate to safely complete daily activities?: Yes Is the patient deaf or have difficulty hearing?: No Does the patient have difficulty seeing, even when wearing glasses/contacts?: No Does the patient have difficulty concentrating, remembering, or making decisions?: No Patient able to express need for assistance with  ADLs?: Yes Does the patient have difficulty dressing or bathing?: No Independently performs ADLs?: Yes (appropriate for developmental age) Does the patient have difficulty walking or climbing stairs?: No Weakness of Legs: None Weakness of Arms/Hands: Left  Permission Sought/Granted                  Emotional Assessment Appearance:: Appears stated age Attitude/Demeanor/Rapport: Gracious, Engaged Affect (typically observed): Pleasant, Appropriate Orientation: : Oriented to Self, Oriented to Place, Oriented to  Time, Oriented to Situation Alcohol / Substance Use: Not Applicable Psych Involvement: No (comment)  Admission diagnosis:  Cellulitis of hand [L03.119] Cellulitis of left hand [L03.114] Cat bite of left hand, initial encounter [M08.676P, W55.01XA] Patient Active Problem List   Diagnosis Date Noted   Cat bite of left hand    Cellulitis of left hand 05/27/2021   PCP:  Pcp, No Pharmacy:   [facility] [address] [place] - [address] [place] [address] Phone: [phone] Fax: [phone]  [facility] [address], [address] [place] [address] Phone: [phone] Fax: [phone]     Social Determinants of Health (SDOH) Interventions    Readmission Risk Interventions No flowsheet data found.

## 2021-05-29 ENCOUNTER — Inpatient Hospital Stay: Payer: Self-pay

## 2021-05-29 DIAGNOSIS — S61452A Open bite of left hand, initial encounter: Secondary | ICD-10-CM

## 2021-05-29 DIAGNOSIS — W5501XA Bitten by cat, initial encounter: Secondary | ICD-10-CM

## 2021-05-29 DIAGNOSIS — L03114 Cellulitis of left upper limb: Principal | ICD-10-CM

## 2021-05-29 LAB — CBC
HCT: 28.9 % — ABNORMAL LOW (ref 36.0–46.0)
Hemoglobin: 10.2 g/dL — ABNORMAL LOW (ref 12.0–15.0)
MCH: 33.1 pg (ref 26.0–34.0)
MCHC: 35.3 g/dL (ref 30.0–36.0)
MCV: 93.8 fL (ref 80.0–100.0)
Platelets: 213 10*3/uL (ref 150–400)
RBC: 3.08 MIL/uL — ABNORMAL LOW (ref 3.87–5.11)
RDW: 12.8 % (ref 11.5–15.5)
WBC: 6.2 10*3/uL (ref 4.0–10.5)
nRBC: 0 % (ref 0.0–0.2)

## 2021-05-29 LAB — BASIC METABOLIC PANEL
Anion gap: 4 — ABNORMAL LOW (ref 5–15)
BUN: 7 mg/dL (ref 6–20)
CO2: 25 mmol/L (ref 22–32)
Calcium: 8.3 mg/dL — ABNORMAL LOW (ref 8.9–10.3)
Chloride: 107 mmol/L (ref 98–111)
Creatinine, Ser: 0.47 mg/dL (ref 0.44–1.00)
GFR, Estimated: >60 mL/min (ref >60)
Glucose, Bld: 98 mg/dL (ref 70–99)
Potassium: 3.6 mmol/L (ref 3.5–5.1)
Sodium: 136 mmol/L (ref 135–145)

## 2021-05-29 LAB — MAGNESIUM: Magnesium: 1.9 mg/dL (ref 1.7–2.4)

## 2021-05-29 IMAGING — MR MR [PERSON_NAME]*[PERSON_NAME]* WO/W CM
8 series · 40 of 40 positions shown · IV contrast (gadavist)
Comparison: None.

CLINICAL DATA: Cat bite to the left wrist with pain and swelling.

EXAM:
MRI OF THE LEFT HAND WITHOUT AND WITH CONTRAST; MRI OF THE LEFT
WRIST WITHOUT AND WITH CONTRAST
TECHNIQUE: Multiplanar, multisequence MR imaging of the left upper extremity
was performed before and after the administration of intravenous
contrast.
CONTRAST:  7mL GADAVIST GADOBUTROL 1 MMOL/ML IV SOLN

[Series 3: T1 · coronal · left · 3.0mm · 0.59mm/px · 4 of 25 slices shown (1 of 4)]
[im 1/25]
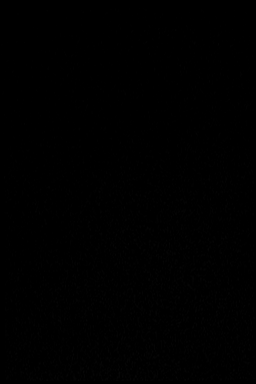
[im 9/25]
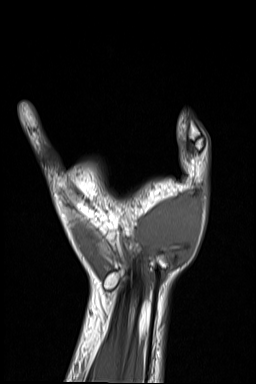
[im 17/25]
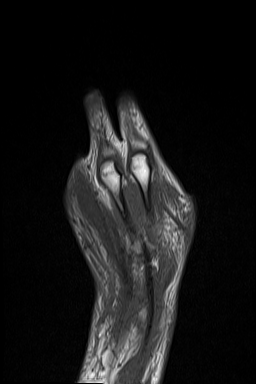
[im 25/25]
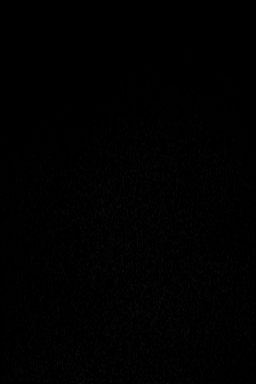

[Series 4: STIR · coronal · left · 3.0mm · 0.59mm/px · 4 of 23 slices shown]
[im 1/23]
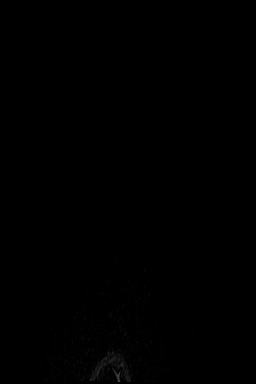
[im 8/23]
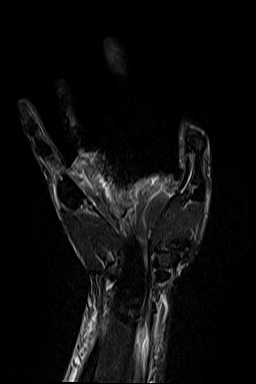
[im 15/23]
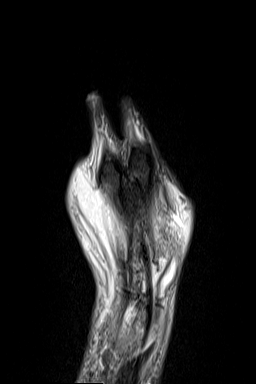
[im 23/23]
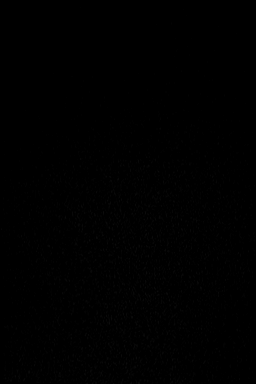

[Series 5: T1 · axial · left · 4.0mm · 0.55mm/px · z∈[-50,+152]mm · 6 of 44 slices shown (2 of 4)]
[im 1/44]
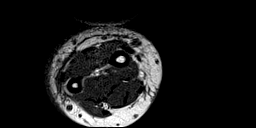
[im 9/44]
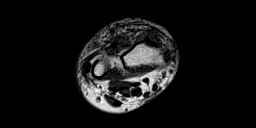
[im 18/44]
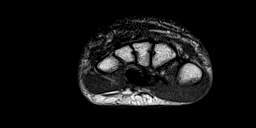
[im 26/44]
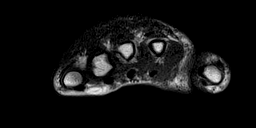
[im 35/44]
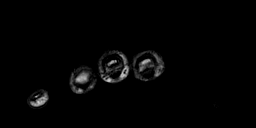
[im 44/44]
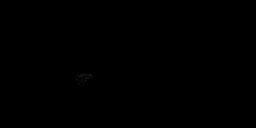

[Series 7: T1 · axial · left · 4.0mm · 0.55mm/px · z∈[-50,+152]mm · 6 of 44 slices shown (3 of 4)]
[im 1/44]
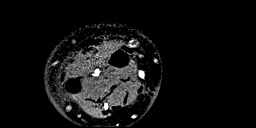
[im 9/44]
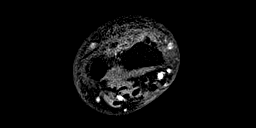
[im 18/44]
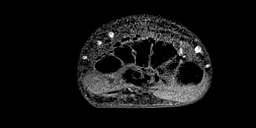
[im 26/44]
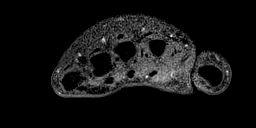
[im 35/44]
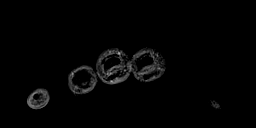
[im 44/44]
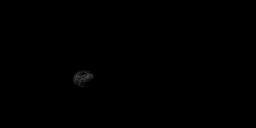

[Series 9: T2 · axial · left · 4.0mm · 0.55mm/px · z∈[-50,+152]mm · 6 of 44 slices shown]
[im 1/44]
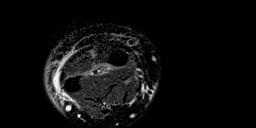
[im 9/44]
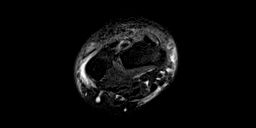
[im 18/44]
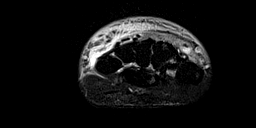
[im 26/44]
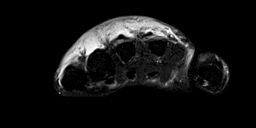
[im 35/44]
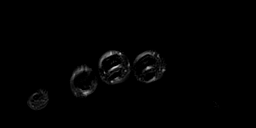
[im 44/44]
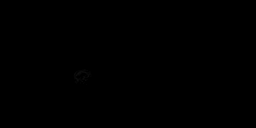

[Series 11: PD fat-sat · sagittal · left · 3.0mm · 0.59mm/px · 5 of 38 slices shown]
[im 1/38]
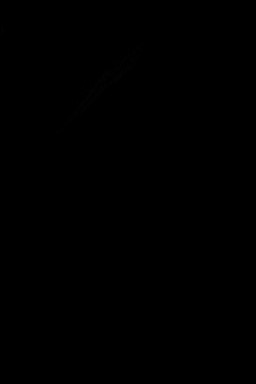
[im 10/38]
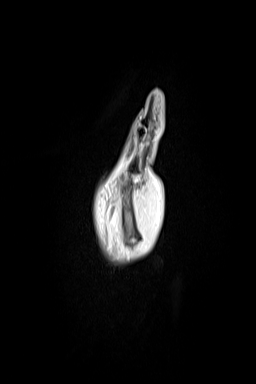
[im 19/38]
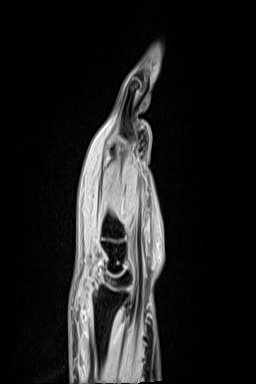
[im 28/38]
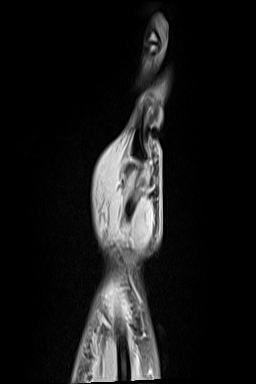
[im 38/38]
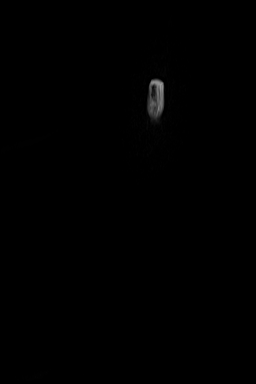

[Series 13: T1 fat-sat · coronal · left · 3.0mm · 0.59mm/px · 3 of 23 slices shown]
[im 1/23]
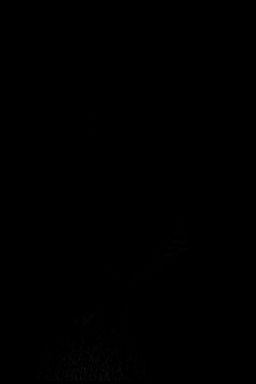
[im 12/23]
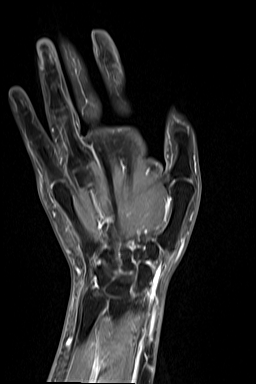
[im 23/23]
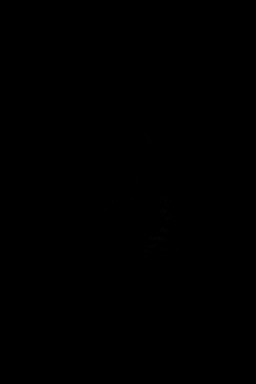

[Series 15: T1 · axial · left · 4.0mm · 0.55mm/px · z∈[-50,+156]mm · 6 of 45 slices shown (4 of 4)]
[im 1/45]
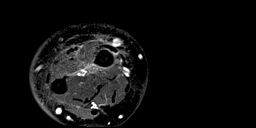
[im 9/45]
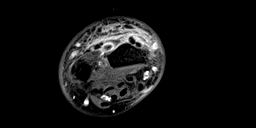
[im 18/45]
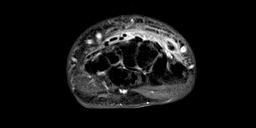
[im 27/45]
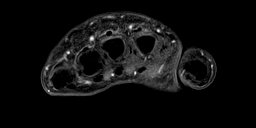
[im 36/45]
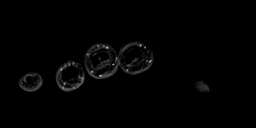
[im 45/45]
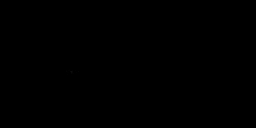

[40 of 40 positions shown; findings below may reference images not displayed]

FINDINGS: There is marked subcutaneous soft tissue swelling/edema/fluid
involving the dorsum of the wrist and hand consistent with severe
cellulitis. No discrete rim enhancing drainable soft tissue abscess
is identified.

Rim enhancing tenosynovitis involving the second and third dorsal
compartment wrist tendons consistent with septic tenosynovitis. This
is most notably involving the extensor pollicis longus tendon but
also the extensor carpi radialis longus and brevis tendons. The APL
and EPB tendons are not involved.

I do not see any MR findings suspicious for septic arthritis or
osteomyelitis. No findings for myofasciitis or pyomyositis.
IMPRESSION: 1. Severe cellulitis involving the dorsum of the wrist and hand. No
discrete drainable soft tissue abscess.
2. Septic tenosynovitis involving the second and third dorsal
compartment wrist tendons.
3. No findings for septic arthritis or osteomyelitis.

## 2021-05-29 IMAGING — MR MR WRIST*L* WO/W CM
8 series · 40 of 40 positions shown · IV contrast (gadavist)
Comparison: None.

CLINICAL DATA: Cat bite to the left wrist with pain and swelling.

EXAM:
MRI OF THE LEFT HAND WITHOUT AND WITH CONTRAST; MRI OF THE LEFT
WRIST WITHOUT AND WITH CONTRAST
TECHNIQUE: Multiplanar, multisequence MR imaging of the left upper extremity
was performed before and after the administration of intravenous
contrast.
CONTRAST:  7mL GADAVIST GADOBUTROL 1 MMOL/ML IV SOLN

[Series 3: T1 · coronal · left · 3.0mm · 0.59mm/px · 4 of 25 slices shown (1 of 4)]
[im 1/25]
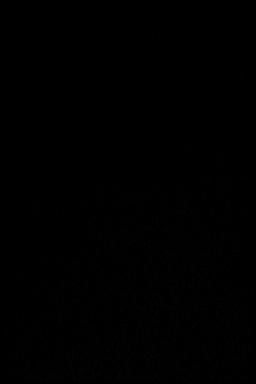
[im 9/25]
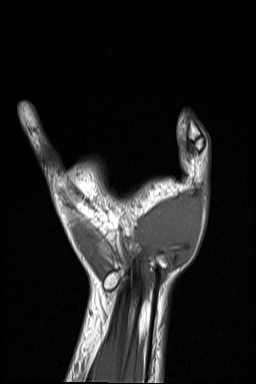
[im 17/25]
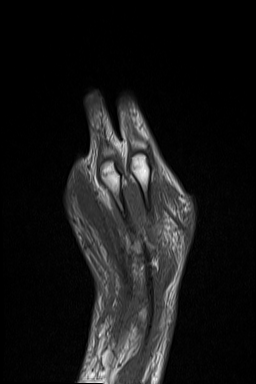
[im 25/25]
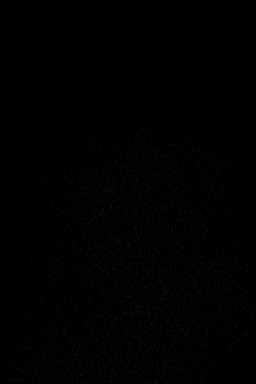

[Series 4: STIR · coronal · left · 3.0mm · 0.59mm/px · 4 of 23 slices shown]
[im 1/23]
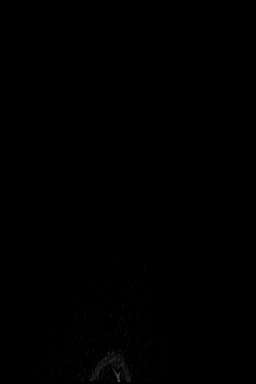
[im 8/23]
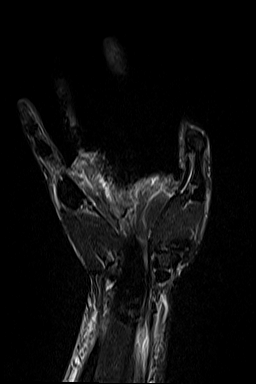
[im 15/23]
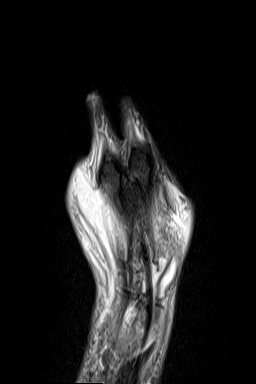
[im 23/23]
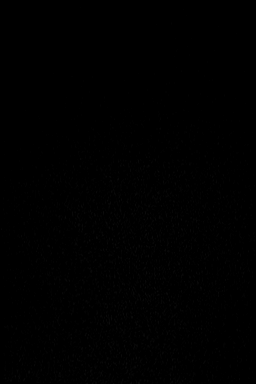

[Series 5: T1 · axial · left · 4.0mm · 0.55mm/px · z∈[-50,+152]mm · 6 of 44 slices shown (2 of 4)]
[im 1/44]
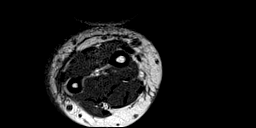
[im 9/44]
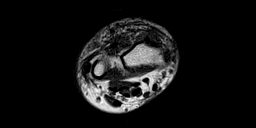
[im 18/44]
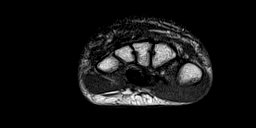
[im 26/44]
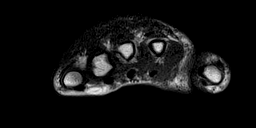
[im 35/44]
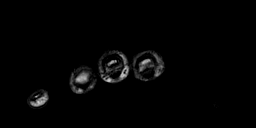
[im 44/44]
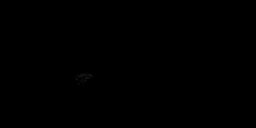

[Series 7: T1 · axial · left · 4.0mm · 0.55mm/px · z∈[-50,+152]mm · 6 of 44 slices shown (3 of 4)]
[im 1/44]
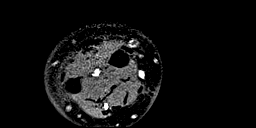
[im 9/44]
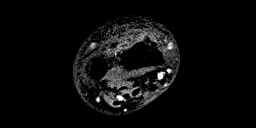
[im 18/44]
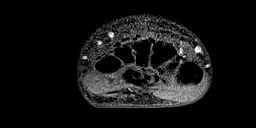
[im 26/44]
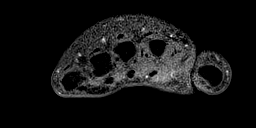
[im 35/44]
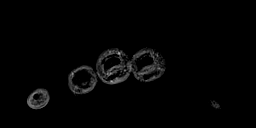
[im 44/44]
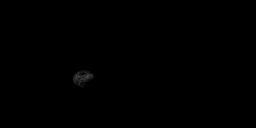

[Series 9: T2 · axial · left · 4.0mm · 0.55mm/px · z∈[-50,+152]mm · 6 of 44 slices shown]
[im 1/44]
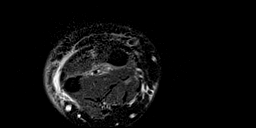
[im 9/44]
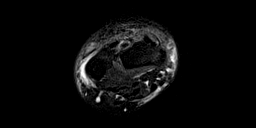
[im 18/44]
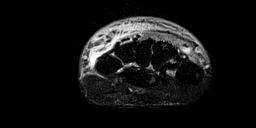
[im 26/44]
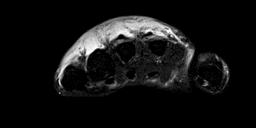
[im 35/44]
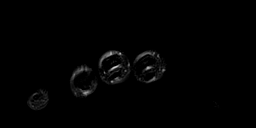
[im 44/44]
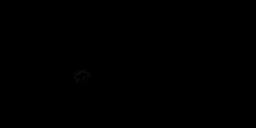

[Series 11: PD fat-sat · sagittal · left · 3.0mm · 0.59mm/px · 5 of 38 slices shown]
[im 1/38]
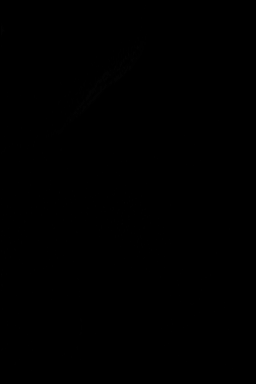
[im 10/38]
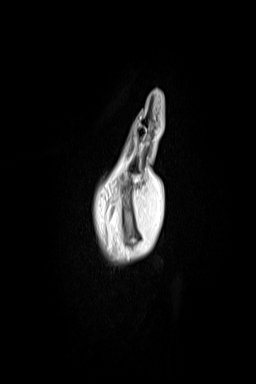
[im 19/38]
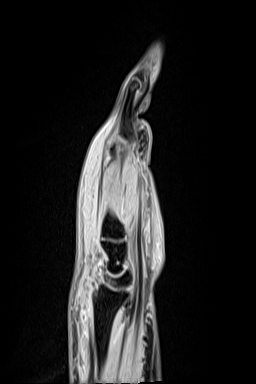
[im 28/38]
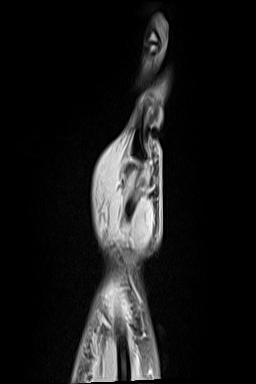
[im 38/38]
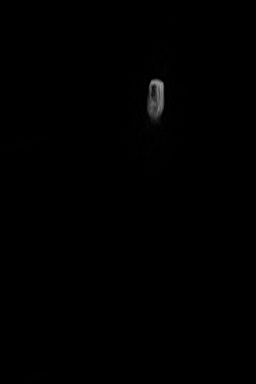

[Series 13: T1 fat-sat · coronal · left · 3.0mm · 0.59mm/px · 3 of 23 slices shown]
[im 1/23]
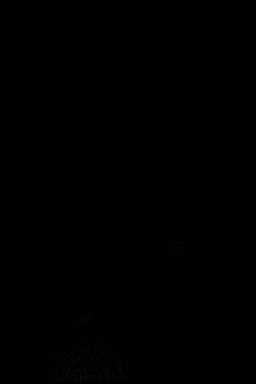
[im 12/23]
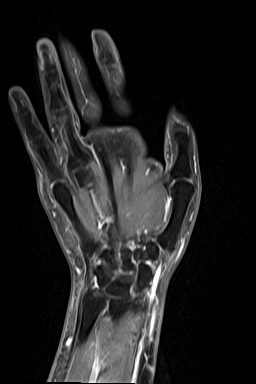
[im 23/23]
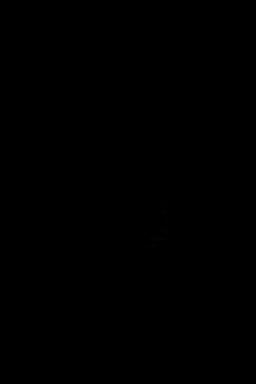

[Series 15: T1 · axial · left · 4.0mm · 0.55mm/px · z∈[-50,+156]mm · 6 of 45 slices shown (4 of 4)]
[im 1/45]
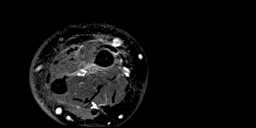
[im 9/45]
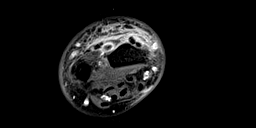
[im 18/45]
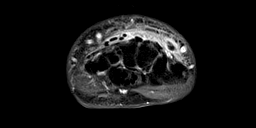
[im 27/45]
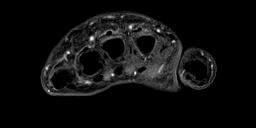
[im 36/45]
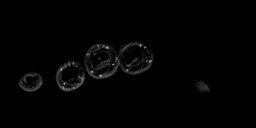
[im 45/45]
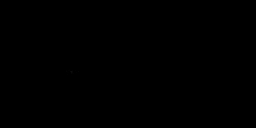

[40 of 40 positions shown; findings below may reference images not displayed]

FINDINGS: There is marked subcutaneous soft tissue swelling/edema/fluid
involving the dorsum of the wrist and hand consistent with severe
cellulitis. No discrete rim enhancing drainable soft tissue abscess
is identified.

Rim enhancing tenosynovitis involving the second and third dorsal
compartment wrist tendons consistent with septic tenosynovitis. This
is most notably involving the extensor pollicis longus tendon but
also the extensor carpi radialis longus and brevis tendons. The APL
and EPB tendons are not involved.

I do not see any MR findings suspicious for septic arthritis or
osteomyelitis. No findings for myofasciitis or pyomyositis.
IMPRESSION: 1. Severe cellulitis involving the dorsum of the wrist and hand. No
discrete drainable soft tissue abscess.
2. Septic tenosynovitis involving the second and third dorsal
compartment wrist tendons.
3. No findings for septic arthritis or osteomyelitis.

## 2021-05-29 MED ORDER — GADOBUTROL 1 MMOL/ML IV SOLN
7.0000 mL | Freq: Once | INTRAVENOUS | Status: AC | PRN
  Administered 2021-05-29: 7 mL via INTRAVENOUS

## 2021-05-29 NOTE — Progress Notes (Signed)
PROGRESS NOTE    Deborah Hurley  ACZ:[id number] DOB: Sep 30, 1995 DOA: 05/27/2021 PCP: Pcp, No    Brief Narrative:  Deborah Hurley is a 25 year old female with no previous medical history who presented to [facility] ED on 10/24 with progressive left hands/forearm pain, swelling and redness.  Patient reports that he was bit by a stray cat on Friday, 05/24/2021.  Denies any drainage from her hand or forearm, no fever/chills.  In the ED, temperature 99.1 F, BP 99/57.  Influenza A/B PCR negative.  WBC 9.5, hemoglobin 11.0, platelets 241.  Sodium 139, potassium 3.0, chloride 100, CO2 25, glucose 124, BUN 11, creatinine 0.47.  Lactic acid 1.3.  COVID-19 PCR negative.  Blood cultures drawn.  Patient was given 50 mcg IV fentanyl, potassium chloride, started on IV Unasyn and vancomycin, 50 mg of IV Toradol, and given rabies vaccination, rabies immunoglobulin and tetanus shot.  TRH consulted for further evaluation management of cellulitis secondary to cat bite.   Assessment & Plan:   Active Problems:   Cellulitis of left hand   Cat bite of left hand   Left hand cellulitis Patient presenting to the ED with progressive left hand pain, swelling and erythema following stray cat bite on 05/24/2021.  Patient with low-grade fever but no leukocytosis.  Initially started on empiric antibiotics with IV Unasyn and vancomycin.  Patient received tetanus vaccination, rabies vaccination/immunoglobulin in the ED on 10/24. --Orthopedics following, appreciate assistance --Infectious disease consulted for assistance with antibiotic regimen/duration --Blood cultures x210/24: No growth x2 days --Continue Unasyn 3 g IV every 6 hours --Discontinue vancomycin today --Given persistent edema and decreased range of motion of the left wrist, obtaining MR left hand/wrist to evaluate for possible underlying abscess --Percocet 5-325 mg p.o. q6h PRN moderate pain --Morphine 1 mg q4h prn severe pain --Will need to continue rabies  vaccination on day 3, 7, and 14 from initial vaccination; next due 10/27  Hypokalemia: Resolved Potassium 3.0 admission, repleted.  Now resolved.  Hypotension: Resolved   DVT prophylaxis: enoxaparin (LOVENOX) injection 40 mg Start: 05/27/21 0800   Code Status: Full Code Family Communication: No family present at bedside this morning  Disposition Plan:  Level of care: Med-Surg Status is: Inpatient  Remains inpatient appropriate because: Remains on IV antibiotics, awaiting further imaging with MRI left wrist, awaiting further input from orthopedics and infectious disease.     Consultants:  Orthopedics Infectious disease  Procedures:  None  Antimicrobials:  Unasyn 10/24>> Vancomycin 10/24 - 10/26   Subjective: Patient seen examined at bedside, resting comfortably.  Walking around room.  Redness to left wrist improved but continues with significant edema, pain and decreased range of motion.  Consulted infectious disease today.  Discontinue vancomycin, continues on Unasyn.  Will obtain MRI left hand/wrist to rule out underlying abscess.  No other questions or concerns at this time.  Denies headache, no current fever/chills/night sweats, no nausea/vomiting/diarrhea, no chest pain, no palpitations, no shortness of breath, no abdominal pain, no weakness, no fatigue, no paresthesias.  No acute events overnight per nurse staff.  Objective: Vitals:   05/28/21 1620 05/29/21 0540 05/29/21 0821 05/29/21 1201  BP: 111/68 (!) 96/58 110/61 (!) 106/57  Pulse: 77 80 69 68  Resp: 16  18 16   Temp: 97.7 F (36.5 C) 98.6 F (37 C) 98.2 F (36.8 C) 97.8 F (36.6 C)  TempSrc: Oral Oral  Oral  SpO2: 100% 98% 100% 100%  Weight:      Height:        Intake/Output  Summary (Last 24 hours) at 05/29/2021 1543 Last data filed at 05/29/2021 0130 Gross per 24 hour  Intake 1045.16 ml  Output --  Net 1045.16 ml   Filed Weights   05/26/21 2106  Weight: 68 kg    Examination:  General exam:  Appears calm and comfortable  Respiratory system: Clear to auscultation. Respiratory effort normal. Cardiovascular system: S1 & S2 heard, RRR. No JVD, murmurs, rubs, gallops or clicks. No pedal edema. Gastrointestinal system: Abdomen is nondistended, soft and nontender. No organomegaly or masses felt. Normal bowel sounds heard. Central nervous system: Alert and oriented. No focal neurological deficits. Extremities: Left hand/wrist with edema, decreased passive/active range of motion due to pain, erythema improved Skin: No rashes, lesions or ulcers Psychiatry: Judgement and insight appear normal. Mood & affect appropriate.     Data Reviewed: I have personally reviewed following labs and imaging studies  CBC: Recent Labs  Lab 05/27/21 0323 05/28/21 0607 05/29/21 0332  WBC 9.5 6.8 6.2  NEUTROABS 7.2  --   --   HGB 11.0* 9.7* 10.2*  HCT 31.7* 29.3* 28.9*  MCV 96.1 95.4 93.8  PLT 241 203 213   Basic Metabolic Panel: Recent Labs  Lab 05/27/21 0323 05/28/21 0607 05/29/21 0332  NA 139 136 136  K 3.0* 4.0 3.6  CL 100 108 107  CO2 25 22 25   GLUCOSE 124* 95 98  BUN 11 7 7   CREATININE 0.47 0.44 0.47  CALCIUM 8.6* 8.4* 8.3*  MG 2.1 2.0 1.9   GFR: Estimated Creatinine Clearance: 94.9 mL/min (by C-G formula based on SCr of 0.47 mg/dL). Liver Function Tests: No results for input(s): AST, ALT, ALKPHOS, BILITOT, PROT, ALBUMIN in the last 168 hours. No results for input(s): LIPASE, AMYLASE in the last 168 hours. No results for input(s): AMMONIA in the last 168 hours. Coagulation Profile: No results for input(s): INR, PROTIME in the last 168 hours. Cardiac Enzymes: No results for input(s): CKTOTAL, CKMB, CKMBINDEX, TROPONINI in the last 168 hours. BNP (last 3 results) No results for input(s): PROBNP in the last 8760 hours. HbA1C: No results for input(s): HGBA1C in the last 72 hours. CBG: No results for input(s): GLUCAP in the last 168 hours. Lipid Profile: No results for  input(s): CHOL, HDL, LDLCALC, TRIG, CHOLHDL, LDLDIRECT in the last 72 hours. Thyroid Function Tests: No results for input(s): TSH, T4TOTAL, FREET4, T3FREE, THYROIDAB in the last 72 hours. Anemia Panel: No results for input(s): VITAMINB12, FOLATE, FERRITIN, TIBC, IRON, RETICCTPCT in the last 72 hours. Sepsis Labs: Recent Labs  Lab 05/27/21 0323 05/27/21 0535  LATICACIDVEN 1.3 1.1    Recent Results (from the past 240 hour(s))  Blood culture (routine x 2)     Status: None (Preliminary result)   Collection Time: 05/27/21  3:13 AM   Specimen: BLOOD  Result Value Ref Range Status   Specimen Description BLOOD RIGHT ANTECUBITAL  Final   Special Requests   Final    BOTTLES DRAWN AEROBIC AND ANAEROBIC Blood Culture adequate volume   Culture   Final    NO GROWTH 2 DAYS Performed at [facility], [address], [place], [address] [place]    Report Status PENDING  Incomplete  Blood culture (routine x 2)     Status: None (Preliminary result)   Collection Time: 05/27/21  3:19 AM   Specimen: BLOOD  Result Value Ref Range Status   Specimen Description BLOOD BLOOD RIGHT HAND  Final   Special Requests   Final    BOTTLES DRAWN  AEROBIC AND ANAEROBIC Blood Culture adequate volume   Culture   Final    NO GROWTH 2 DAYS Performed at [facility], [address] [place], [place] [address]    Report Status PENDING  Incomplete  Resp Panel by RT-PCR (Flu A&B, Covid) Nasopharyngeal Swab     Status: None   Collection Time: 05/27/21  3:45 AM   Specimen: Nasopharyngeal Swab; Nasopharyngeal(NP) swabs in vial transport medium  Result Value Ref Range Status   SARS Coronavirus 2 by RT PCR NEGATIVE NEGATIVE Final    Comment: (NOTE) SARS-CoV-2 target nucleic acids are NOT DETECTED.  The SARS-CoV-2 RNA is generally detectable in upper respiratory specimens during the acute phase of infection. The lowest concentration of SARS-CoV-2 viral copies this assay can detect is 138 copies/mL. A  negative result does not preclude SARS-Cov-2 infection and should not be used as the sole basis for treatment or other patient management decisions. A negative result may occur with  improper specimen collection/handling, submission of specimen other than nasopharyngeal swab, presence of viral mutation(s) within the areas targeted by this assay, and inadequate number of viral copies(<138 copies/mL). A negative result must be combined with clinical observations, patient history, and epidemiological information. The expected result is Negative.  Fact Sheet for Patients:  BloggerCourse.com  Fact Sheet for Healthcare Providers:  SeriousBroker.it  This test is no t yet approved or cleared by the [place] FDA and  has been authorized for detection and/or diagnosis of SARS-CoV-2 by FDA under an Emergency Use Authorization (EUA). This EUA will remain  in effect (meaning this test can be used) for the duration of the COVID-19 declaration under Section 564(b)(1) of the Act, 21 U.S.C.section 360bbb-3(b)(1), unless the authorization is terminated  or revoked sooner.       Influenza A by PCR NEGATIVE NEGATIVE Final   Influenza B by PCR NEGATIVE NEGATIVE Final    Comment: (NOTE) The Xpert Xpress SARS-CoV-2/FLU/RSV plus assay is intended as an aid in the diagnosis of influenza from Nasopharyngeal swab specimens and should not be used as a sole basis for treatment. Nasal washings and aspirates are unacceptable for Xpert Xpress SARS-CoV-2/FLU/RSV testing.  Fact Sheet for Patients: BloggerCourse.com  Fact Sheet for Healthcare Providers: SeriousBroker.it  This test is not yet approved or cleared by the [place] FDA and has been authorized for detection and/or diagnosis of SARS-CoV-2 by FDA under an Emergency Use Authorization (EUA). This EUA will remain in effect (meaning this test can  be used) for the duration of the COVID-19 declaration under Section 564(b)(1) of the Act, 21 U.S.C. section 360bbb-3(b)(1), unless the authorization is terminated or revoked.  Performed at [facility], [address], [place], [place] [address]          Radiology Studies: No results found.      Scheduled Meds:  enoxaparin (LOVENOX) injection  40 mg Subcutaneous Q24H   Continuous Infusions:  ampicillin-sulbactam (UNASYN) IV 3 g (05/29/21 1431)     LOS: 2 days    Time spent: 42 minutes spent on chart review, discussion with nursing staff, consultants, updating family and interview/physical exam; more than 50% of that time was spent in counseling and/or coordination of care.    Alvira Philips [place], DO [facility] Available via Epic secure chat 7am-7pm After these hours, please refer to coverage provider listed on [email or web address] 05/29/2021, 3:43 PM

## 2021-05-29 NOTE — Consult Note (Signed)
NAME: Deborah Hurley  DOB: 10/30/1995  MRN: [id number]  Date/Time: 05/29/2021 5:55 PM  REQUESTING PROVIDER: Dr. [place] Subjective:  REASON FOR CONSULT: Cat bite injury ? Deborah Hurley is a 25 y.o. female with no significant past medical history was admitted to the hospital with a cat bite to her left hand. As per patient she was trying to lift a feral cat and it bit her . initially she did not even feel that pain of the cat bite and while she saw the blood she just ignored it.  Then 24 hours later she started to have swelling and severe pain l and came to the ED on 05/26/2021.,  48 hours after the bite. She did not have any fever or chills.  Lab vitals in the ED temperature 99.1, BP 99/57. WBC was 9.5. Culture was sent and she was started on Unasyn and vancomycin She was also seen by orthopedic surgeon. Today I am asked to see her to recommend on duration of antibiotics. Patient is doing better than before The swelling is improving The redness is improving as well Patient lives at home with her boyfriend  Past medical history none  Past surgical history none   Social History   Socioeconomic History   Marital status: Single    Spouse name: Not on file   Number of children: Not on file   Years of education: Not on file   Highest education level: Not on file  Occupational History   Not on file  Tobacco Use   Smoking status: Never   Smokeless tobacco: Never  Substance and Sexual Activity   Alcohol use: Not on file   Drug use: Not on file   Sexual activity: Not on file  Other Topics Concern   Not on file  Social History Narrative   Not on file   Social Determinants of Health   Financial Resource Strain: Not on file  Food Insecurity: Not on file  Transportation Needs: Not on file  Physical Activity: Not on file  Stress: Not on file  Social Connections: Not on file  Intimate Partner Violence: Not on file    Family history: Mom has anemia No Known  Allergies I? Current Facility-Administered Medications  Medication Dose Route Frequency Provider Last Rate Last Admin   acetaminophen (TYLENOL) tablet 650 mg  650 mg Oral Q6H PRN Mansy, Jan A, MD   650 mg at 05/27/21 9562   Or   acetaminophen (TYLENOL) suppository 650 mg  650 mg Rectal Q6H PRN Mansy, Jan A, MD       Ampicillin-Sulbactam (UNASYN) 3 g in sodium chloride 0.9 % 100 mL IVPB  3 g Intravenous Q6H Mansy, Jan A, MD 200 mL/hr at 05/29/21 1431 3 g at 05/29/21 1431   enoxaparin (LOVENOX) injection 40 mg  40 mg Subcutaneous Q24H Mansy, Jan A, MD   40 mg at 05/29/21 1033   ketorolac (TORADOL) 15 MG/ML injection 15 mg  15 mg Intravenous Q6H PRN Mansy, Jan A, MD   15 mg at 05/29/21 1029   magnesium hydroxide (MILK OF MAGNESIA) suspension 30 mL  30 mL Oral Daily PRN Mansy, Jan A, MD       morphine 2 MG/ML injection 1 mg  1 mg Intravenous Q4H PRN Charise Killian, MD       ondansetron [facility]) tablet 4 mg  4 mg Oral Q6H PRN Mansy, Jan A, MD       Or   ondansetron [facility]) injection 4 mg  4 mg  Intravenous Q6H PRN Mansy, Vernetta Honey, MD       oxyCODONE-acetaminophen (PERCOCET/ROXICET) 5-325 MG per tablet 1 tablet  1 tablet Oral Q6H PRN Charise Killian, MD   1 tablet at 05/29/21 0545   traZODone (DESYREL) tablet 25 mg  25 mg Oral QHS PRN Mansy, Jan A, MD   25 mg at 05/28/21 2159     Abtx:  Anti-infectives (From admission, onward)    Start     Dose/Rate Route Frequency Ordered Stop   05/28/21 1000  vancomycin (VANCOCIN) IVPB 1000 mg/200 mL premix  Status:  Discontinued        1,000 mg 200 mL/hr over 60 Minutes Intravenous Every 12 hours 05/28/21 0719 05/29/21 1146   05/27/21 2000  vancomycin (VANCOREADY) IVPB 750 mg/150 mL  Status:  Discontinued        750 mg 150 mL/hr over 60 Minutes Intravenous Every 12 hours 05/27/21 0759 05/28/21 0719   05/27/21 0945  Ampicillin-Sulbactam (UNASYN) 3 g in sodium chloride 0.9 % 100 mL IVPB        3 g 200 mL/hr over 30 Minutes Intravenous Every 6 hours  05/27/21 0727     05/27/21 0800  vancomycin (VANCOREADY) IVPB 500 mg/100 mL        500 mg 100 mL/hr over 60 Minutes Intravenous  Once 05/27/21 0756 05/27/21 1039   05/27/21 0730  vancomycin (VANCOCIN) IVPB 1000 mg/200 mL premix  Status:  Discontinued        1,000 mg 200 mL/hr over 60 Minutes Intravenous  Once 05/27/21 0727 05/27/21 0756   05/27/21 0315  Ampicillin-Sulbactam (UNASYN) 3 g in sodium chloride 0.9 % 100 mL IVPB        3 g 200 mL/hr over 30 Minutes Intravenous  Once 05/27/21 0301 05/27/21 0417   05/27/21 0315  vancomycin (VANCOCIN) IVPB 1000 mg/200 mL premix        1,000 mg 200 mL/hr over 60 Minutes Intravenous  Once 05/27/21 0301 05/27/21 0531       REVIEW OF SYSTEMS:  Const: negative fever, negative chills, negative weight loss Eyes: negative diplopia or visual changes, negative eye pain ENT: negative coryza, negative sore throat Resp: negative cough, hemoptysis, dyspnea Cards: negative for chest pain, palpitations, lower extremity edema GU: negative for frequency, dysuria and hematuria GI: Negative for abdominal pain, diarrhea, bleeding, constipation Skin: negative for rash and pruritus Heme: negative for easy bruising and gum/nose bleeding MS: As above Neurolo:negative for headaches, dizziness, vertigo, memory problems  Psych: negative for feelings of anxiety, depression  Endocrine: negative for thyroid, diabetes Allergy/Immunology- negative for any medication or food allergies ? Pertinent Positives include : Objective:  VITALS:  BP 99/61 (BP Location: Left Arm)   Pulse 77   Temp 97.9 F (36.6 C) (Oral)   Resp 18   Ht 5\' 1"  (1.549 m)   Wt 68 kg   LMP 05/26/2021 (Exact Date)   SpO2 100%   BMI 28.34 kg/m  PHYSICAL EXAM:  General: Alert, cooperative, no distress, appears stated age.  Head: Normocephalic, without obvious abnormality, atraumatic. Eyes: Conjunctivae clear, anicteric sclerae. Pupils are equal ENT Nares normal. No drainage or sinus  tenderness. Lips, mucosa, and tongue normal. No Thrush Neck: Supple, symmetrical, no adenopathy, thyroid: non tender no carotid bruit and no JVD. Back: No CVA tenderness. Lungs: Clear to auscultation bilaterally. No Wheezing or Rhonchi. No rales. Heart: Regular rate and rhythm, no murmur, rub or gallop. Abdomen: Soft, non-tender,not distended. Bowel sounds normal. No masses Extremities: Left hand  dorsum swollen and left forearm lower third swollen.  Cat teeth marks seen No erythema Tenderness present Movement at the wrist joint restricted but present  Today     On admission   skin: No rashes or lesions. Or bruising Lymph: Cervical, supraclavicular normal. Neurologic: Grossly non-focal Pertinent Labs Lab Results CBC    Component Value Date/Time   WBC 6.2 05/29/2021 0332   RBC 3.08 (L) 05/29/2021 0332   HGB 10.2 (L) 05/29/2021 0332   HCT 28.9 (L) 05/29/2021 0332   PLT 213 05/29/2021 0332   MCV 93.8 05/29/2021 0332   MCH 33.1 05/29/2021 0332   MCHC 35.3 05/29/2021 0332   RDW 12.8 05/29/2021 0332   LYMPHSABS 1.7 05/27/2021 0323   MONOABS 0.5 05/27/2021 0323   EOSABS 0.1 05/27/2021 0323   BASOSABS 0.0 05/27/2021 0323    CMP Latest Ref Rng & Units 05/29/2021 05/28/2021 05/27/2021  Glucose 70 - 99 mg/dL 98 95 448(J)  BUN 6 - 20 mg/dL 7 7 11   Creatinine 0.44 - 1.00 mg/dL 8.56 3.14  Sodium 135 - 145 mmol/L 136 136 139  Potassium 3.5 - 5.1 mmol/L 3.6 4.0 3.0(L)  Chloride 98 - 111 mmol/L 107 108 100  CO2 22 - 32 mmol/L 25 22 25   Calcium 8.9 - 10.3 mg/dL 8.3(L) 8.4(L) 8.6(L)      Microbiology: Recent Results (from the past 240 hour(s))  Blood culture (routine x 2)     Status: None (Preliminary result)   Collection Time: 05/27/21  3:13 AM   Specimen: BLOOD  Result Value Ref Range Status   Specimen Description BLOOD RIGHT ANTECUBITAL  Final   Special Requests   Final    BOTTLES DRAWN AEROBIC AND ANAEROBIC Blood Culture adequate volume   Culture   Final    NO  GROWTH 2 DAYS Performed at [facility], [address], [place], [address] [place]    Report Status PENDING  Incomplete  Blood culture (routine x 2)     Status: None (Preliminary result)   Collection Time: 05/27/21  3:19 AM   Specimen: BLOOD  Result Value Ref Range Status   Specimen Description BLOOD BLOOD RIGHT HAND  Final   Special Requests   Final    BOTTLES DRAWN AEROBIC AND ANAEROBIC Blood Culture adequate volume   Culture   Final    NO GROWTH 2 DAYS Performed at [facility], [address], [place], [address] [place]    Report Status PENDING  Incomplete  Resp Panel by RT-PCR (Flu A&B, Covid) Nasopharyngeal Swab     Status: None   Collection Time: 05/27/21  3:45 AM   Specimen: Nasopharyngeal Swab; Nasopharyngeal(NP) swabs in vial transport medium  Result Value Ref Range Status   SARS Coronavirus 2 by RT PCR NEGATIVE NEGATIVE Final    Comment: (NOTE) SARS-CoV-2 target nucleic acids are NOT DETECTED.  The SARS-CoV-2 RNA is generally detectable in upper respiratory specimens during the acute phase of infection. The lowest concentration of SARS-CoV-2 viral copies this assay can detect is 138 copies/mL. A negative result does not preclude SARS-Cov-2 infection and should not be used as the sole basis for treatment or other patient management decisions. A negative result may occur with  improper specimen collection/handling, submission of specimen other than nasopharyngeal swab, presence of viral mutation(s) within the areas targeted by this assay, and inadequate number of viral copies(<138 copies/mL). A negative result must be combined with clinical observations, patient history, and epidemiological information. The expected result is Negative.  Fact Sheet for Patients:  BloggerCourse.com  Fact Sheet for Healthcare Providers:  SeriousBroker.it  This test is no t yet approved or cleared by the [place] FDA and  has been authorized for detection and/or diagnosis of SARS-CoV-2 by FDA under an Emergency Use Authorization (EUA). This EUA will remain  in effect (meaning this test can be used) for the duration of the COVID-19 declaration under Section 564(b)(1) of the Act, 21 U.S.C.section 360bbb-3(b)(1), unless the authorization is terminated  or revoked sooner.       Influenza A by PCR NEGATIVE NEGATIVE Final   Influenza B by PCR NEGATIVE NEGATIVE Final    Comment: (NOTE) The Xpert Xpress SARS-CoV-2/FLU/RSV plus assay is intended as an aid in the diagnosis of influenza from Nasopharyngeal swab specimens and should not be used as a sole basis for treatment. Nasal washings and aspirates are unacceptable for Xpert Xpress SARS-CoV-2/FLU/RSV testing.  Fact Sheet for Patients: BloggerCourse.com  Fact Sheet for Healthcare Providers: SeriousBroker.it  This test is not yet approved or cleared by the [place] FDA and has been authorized for detection and/or diagnosis of SARS-CoV-2 by FDA under an Emergency Use Authorization (EUA). This EUA will remain in effect (meaning this test can be used) for the duration of the COVID-19 declaration under Section 564(b)(1) of the Act, 21 U.S.C. section 360bbb-3(b)(1), unless the authorization is terminated or revoked.  Performed at [facility], [address] [place], [place] [address]     IMAGING RESULTS: None I have personally reviewed the films ? Impression/Recommendation Cat bite injury of the left forearm with cellulitis .  No evidence of abscess clinically patient is on Unasyn and vancomycin.  The latter can be discontinued Patient is apparently getting an MRI today She will need a total of 10 days of antibiotic.  On discharge IV Unasyn can be switched to p.o. Augmentin.  Anemia  Discussed the management with the patient.  Patient seen with Transport planner. ___________________________________________________ Discussed with patient, requesting provider Note:  This document was prepared using Dragon voice recognition software and may include unintentional dictation errors.

## 2021-05-29 NOTE — Progress Notes (Signed)
Patient states that her hand pain and swelling are mildly improved. She has been keeping her hand elevated most of the time.   LUE: +ain/pin/u motor.  Patient has active and passive range of motion of the wrist and fingers.  Exam improved with more passive and active motion compared to that on 05/28/2021 SILT r/u/m/ax +rad pulse Significant swelling about the dorsum of the hand. Prior swelling about the fingers has improved. No significant erythema. There is no palpable fluid collection. There is mild tenderness to palpation, most significant in the region of the puncture wounds.  No active drainage from these sites.     Assessment/Plan: 25 year old female with soft tissue swelling, erythema, and pain after cat bite consistent with cellulitis.  There is not appear to be any focal fluid collection or other condition amenable to surgical treatment at this time. 1.  Recommend continued IV antibiotics including Unasyn to cover for organisms transmitted via cat bite.  2.  Keep hand elevated and immobilized as much as possible. 3.  She has mild improvement in her symptoms. MRI ordered by primary team. Will plan to review results of MRI after completed.

## 2021-05-30 DIAGNOSIS — L03119 Cellulitis of unspecified part of limb: Secondary | ICD-10-CM

## 2021-05-30 DIAGNOSIS — M659 Synovitis and tenosynovitis, unspecified: Secondary | ICD-10-CM

## 2021-05-30 MED ORDER — RABIES VACCINE, PCEC IM SUSR
1.0000 mL | Freq: Once | INTRAMUSCULAR | Status: AC
  Administered 2021-05-30: 1 mL via INTRAMUSCULAR
  Filled 2021-05-30: qty 1

## 2021-05-30 NOTE — Progress Notes (Signed)
Hand pain continues to improve.  Swelling mildly improved, but still present over the dorsum of the hand. She has been keeping her hand elevated most of the time.   LUE: +ain/pin/u motor.  Patient has active and passive range of motion of the wrist and fingers.  Exam improved with more passive and active motion compared to that on 05/29/2021 SILT r/u/m/ax +rad pulse Significant swelling about the dorsum of the hand. Prior swelling about the fingers has improved. No significant erythema. There is no palpable fluid collection. There is mild tenderness to palpation, most significant in the region of the puncture wounds.  No active drainage from these sites.     MRI L hand/wrist: IMPRESSION: 1. Severe cellulitis involving the dorsum of the wrist and hand. No discrete drainable soft tissue abscess. 2. Septic tenosynovitis involving the second and third dorsal compartment wrist tendons. 3. No findings for septic arthritis or osteomyelitis.   Assessment/Plan: 25 year old female with soft tissue swelling, erythema, and pain after cat bite consistent with cellulitis.  There is not appear to be any focal fluid collection or other condition amenable to surgical treatment at this time. 1.  Recommend continued IV antibiotics including Unasyn to cover for organisms transmitted via cat bite.  Will defer antibiotic management to the infectious diseases team.  It would be reasonable to discharge her on p.o. Augmentin from my perspective 2.  Keep hand elevated and immobilized as much as possible. 3.  We will plan to follow patient peripherally.  Please page with any further questions.  Orthopedic follow-up as needed if symptoms persist.

## 2021-05-30 NOTE — TOC Progression Note (Addendum)
Transition of Care [facility]) - Progression Note    Patient Details  Name: Deborah Hurley MRN: [id number] Date of Birth: 05/22/1996  Transition [facility] Care [facility]) CM/SW Contact  Caryn Section, RN Phone Number: 05/30/2021, 2:50 PM  Clinical Narrative:   Patient's length of stay to be determined, financial counselors consulted:  Amy Virgel Bouquet and Wilhemena Durie.  TOC to follow.  Addendum:  As per D. Copeland, patient was assessed and is not currently eligible for medicaid.    Barriers to Discharge: Continued Medical Work up  Expected Discharge Plan and Services     Discharge Planning Services: CM Consult   Living arrangements for the past 2 months: Single Family Home                                       Social Determinants of Health (SDOH) Interventions    Readmission Risk Interventions No flowsheet data found.

## 2021-05-30 NOTE — Progress Notes (Signed)
Date of Admission:  05/27/2021     ID: Deborah Hurley is a 25 y.o. female  Active Problems:   Cellulitis of left hand   Cat bite of left hand   Tenosynovitis of left wrist    Subjective: Patient has swelling of the left swollen on the dorsal aspect.   Mobility of the left wrist is restricted. No fever   Medications:   enoxaparin (LOVENOX) injection  40 mg Subcutaneous Q24H    Objective: Vital signs in last 24 hours: Temp:  [97.9 F (36.6 C)-98.4 F (36.9 C)] 98.3 F (36.8 C) (10/27 1131) Pulse Rate:  [59-77] 59 (10/27 1131) Resp:  [16-18] 16 (10/27 1131) BP: (97-103)/(47-69) 102/51 (10/27 1131) SpO2:  [99 %-100 %] 99 % (10/27 1131)  PHYSICAL EXAM:  General: Alert, cooperative, no distress, appears stated age.  Head: Normocephalic, without obvious abnormality, atraumatic. Eyes: Conjunctivae clear, anicteric sclerae. Pupils are equal ENT Nares normal. No drainage or sinus tenderness. Lips, mucosa, and tongue normal. No Thrush Neck: Supple, symmetrical, no adenopathy, thyroid: non tender no carotid bruit and no JVD. Back: No CVA tenderness. Lungs: Clear to auscultation bilaterally. No Wheezing or Rhonchi. No rales. Heart: Regular rate and rhythm, no murmur, rub or gallop. Abdomen: Soft, non-tender,not distended. Bowel sounds normal. No masses Extremities:  Dorsum of the left hand swollen. Decreased movement of the wrist Skin: No rashes or lesions. Or bruising Lymph: Cervical, supraclavicular normal. Neurologic: Grossly non-focal  Lab Results Recent Labs    05/28/21 0607 05/29/21 0332  WBC 6.8 6.2  HGB 9.7* 10.2*  HCT 29.3* 28.9*  NA 136 136  K 4.0 3.6  CL 108 107  CO2 22 25  BUN 7 7  CREATININE 0.44 0.47   Liver Panel No results for input(s): PROT, ALBUMIN, AST, ALT, ALKPHOS, BILITOT, BILIDIR, IBILI in the last 72 hours. Sedimentation Rate No results for input(s): ESRSEDRATE in the last 72 hours. C-Reactive Protein No results for input(s): CRP in  the last 72 hours.  Microbiology:  Studies/Results: MR HAND LEFT W WO CONTRAST  Result Date: 05/29/2021 CLINICAL DATA:  Cat bite to the left wrist with pain and swelling. EXAM: MRI OF THE LEFT HAND WITHOUT AND WITH CONTRAST; MRI OF THE LEFT WRIST WITHOUT AND WITH CONTRAST TECHNIQUE: Multiplanar, multisequence MR imaging of the left upper extremity was performed before and after the administration of intravenous contrast. CONTRAST:  58mL GADAVIST GADOBUTROL 1 MMOL/ML IV SOLN COMPARISON:  None. FINDINGS: There is marked subcutaneous soft tissue swelling/edema/fluid involving the dorsum of the wrist and hand consistent with severe cellulitis. No discrete rim enhancing drainable soft tissue abscess is identified. Rim enhancing tenosynovitis involving the second and third dorsal compartment wrist tendons consistent with septic tenosynovitis. This is most notably involving the extensor pollicis longus tendon but also the extensor carpi radialis longus and brevis tendons. The APL and EPB tendons are not involved. I do not see any MR findings suspicious for septic arthritis or osteomyelitis. No findings for myofasciitis or pyomyositis. IMPRESSION: 1. Severe cellulitis involving the dorsum of the wrist and hand. No discrete drainable soft tissue abscess. 2. Septic tenosynovitis involving the second and third dorsal compartment wrist tendons. 3. No findings for septic arthritis or osteomyelitis. Electronically Signed   By: Rudie Meyer M.D.   On: 05/29/2021 21:26   MR WRIST LEFT W WO CONTRAST  Result Date: 05/29/2021 CLINICAL DATA:  Cat bite to the left wrist with pain and swelling. EXAM: MRI OF THE LEFT HAND WITHOUT AND WITH CONTRAST;  MRI OF THE LEFT WRIST WITHOUT AND WITH CONTRAST TECHNIQUE: Multiplanar, multisequence MR imaging of the left upper extremity was performed before and after the administration of intravenous contrast. CONTRAST:  32mL GADAVIST GADOBUTROL 1 MMOL/ML IV SOLN COMPARISON:  None. FINDINGS:  There is marked subcutaneous soft tissue swelling/edema/fluid involving the dorsum of the wrist and hand consistent with severe cellulitis. No discrete rim enhancing drainable soft tissue abscess is identified. Rim enhancing tenosynovitis involving the second and third dorsal compartment wrist tendons consistent with septic tenosynovitis. This is most notably involving the extensor pollicis longus tendon but also the extensor carpi radialis longus and brevis tendons. The APL and EPB tendons are not involved. I do not see any MR findings suspicious for septic arthritis or osteomyelitis. No findings for myofasciitis or pyomyositis. IMPRESSION: 1. Severe cellulitis involving the dorsum of the wrist and hand. No discrete drainable soft tissue abscess. 2. Septic tenosynovitis involving the second and third dorsal compartment wrist tendons. 3. No findings for septic arthritis or osteomyelitis. Electronically Signed   By: Rudie Meyer M.D.   On: 05/29/2021 21:26     Assessment/Plan: cat bite  to the left forearm with cellulitis.  No evidence of abscess clinically.  MRI shows Septic tenosynovitis of the second and third dorsal compartment of the resentments Patient will need a longer course of antibiotic.  On discharge we will give 2 more weeks of  p.o. Augmentin I will follow her up as outpatient. Discussed the management with the patient and the care team.

## 2021-05-30 NOTE — Progress Notes (Signed)
PROGRESS NOTE    Laurynn Mccorvey  ONG:[id number] DOB: 23-Oct-1995 DOA: 05/27/2021 PCP: Pcp, No    Brief Narrative:  Deborah Hurley is a 25 year old female with no previous medical history who presented to [facility] ED on 10/24 with progressive left hands/forearm pain, swelling and redness.  Patient reports that he was bit by a stray cat on Friday, 05/24/2021.  Denies any drainage from her hand or forearm, no fever/chills.  In the ED, temperature 99.1 F, BP 99/57.  Influenza A/B PCR negative.  WBC 9.5, hemoglobin 11.0, platelets 241.  Sodium 139, potassium 3.0, chloride 100, CO2 25, glucose 124, BUN 11, creatinine 0.47.  Lactic acid 1.3.  COVID-19 PCR negative.  Blood cultures drawn.  Patient was given 50 mcg IV fentanyl, potassium chloride, started on IV Unasyn and vancomycin, 50 mg of IV Toradol, and given rabies vaccination, rabies immunoglobulin and tetanus shot.  TRH consulted for further evaluation management of cellulitis secondary to cat bite.   Assessment & Plan:   Active Problems:   Cellulitis of left hand   Cat bite of left hand   Left hand cellulitis Septic tenosynovitis Patient presenting to the ED with progressive left hand pain, swelling and erythema following stray cat bite on 05/24/2021.  Patient with low-grade fever but no leukocytosis.  Initially started on empiric antibiotics with IV Unasyn and vancomycin.  Patient received tetanus vaccination, rabies vaccination/immunoglobulin in the ED on 10/24.  MR left hand/wrist with and without contrast notable for severe cellulitis dorsum of wrist/hand without drainable soft tissue abscess, septic tenosynovitis involving the second and third dorsal compartment wrist tendons with no findings for septic arthritis/osteomyelitis. --Orthopedics following, appreciate assistance --Infectious disease consulted for assistance with antibiotic regimen/duration --Blood cultures x2 10/24: No growth x 3 days --Continue Unasyn 3 g IV q6h --Percocet  5-325 mg p.o. q6h PRN moderate pain --Toradol 15 mg IV q6h prn --Morphine 1 mg q4h prn severe pain --Will need to continue rabies vaccination on day 3 (today), 7 (10/31), and 14 (11/7) from initial vaccination; next due 10/27  Hypokalemia: Resolved Potassium 3.0 on admission, repleted.  Now resolved.  Hypotension: Resolved   DVT prophylaxis: enoxaparin (LOVENOX) injection 40 mg Start: 05/27/21 0800   Code Status: Full Code Family Communication: No family present at bedside this morning  Disposition Plan:  Level of care: Med-Surg Status is: Inpatient  Remains inpatient appropriate because: Remains on IV antibiotics, awaiting further imaging with MRI left wrist, awaiting further input from orthopedics and infectious disease.     Consultants:  Orthopedics Infectious disease  Procedures:  None  Antimicrobials:  Unasyn 10/24>> Vancomycin 10/24 - 10/26   Subjective: Patient seen examined at bedside, resting comfortably.  Continues with significant edema to the dorsal surface of left hand, erythema now almost completely resolved.  Range of motion improved but continues with pain on thumb extension.  Continues on IV Unasyn.  Will receive second dose of rabies vaccine today.  No other questions or concerns at this time.  Denies headache, no current fever/chills/night sweats, no nausea/vomiting/diarrhea, no chest pain, no palpitations, no shortness of breath, no abdominal pain, no weakness, no fatigue, no paresthesias.  No acute events overnight per nurse staff.  Objective: Vitals:   05/29/21 2028 05/30/21 0457 05/30/21 0732 05/30/21 1131  BP: 103/63 101/69 (!) 97/47 (!) 102/51  Pulse: 74 62 68 (!) 59  Resp: 18 17 18 16   Temp: 98.4 F (36.9 C)  98.4 F (36.9 C) 98.3 F (36.8 C)  TempSrc: Oral   Oral  SpO2: 100% 100% 99% 99%  Weight:      Height:        Intake/Output Summary (Last 24 hours) at 05/30/2021 1213 Last data filed at 05/30/2021 1027 Gross per 24 hour  Intake  240 ml  Output --  Net 240 ml   Filed Weights   05/26/21 2106  Weight: 68 kg    Examination:  General exam: Appears calm and comfortable  Respiratory system: Clear to auscultation. Respiratory effort normal. Cardiovascular system: S1 & S2 heard, RRR. No JVD, murmurs, rubs, gallops or clicks. No pedal edema. Gastrointestinal system: Abdomen is nondistended, soft and nontender. No organomegaly or masses felt. Normal bowel sounds heard. Central nervous system: Alert and oriented. No focal neurological deficits. Extremities: Left hand/wrist with edema, decreased passive/active range of motion due to pain to left thumb on extension, erythema improved Skin: No rashes, lesions or ulcers Psychiatry: Judgement and insight appear normal. Mood & affect appropriate.     Data Reviewed: I have personally reviewed following labs and imaging studies  CBC: Recent Labs  Lab 05/27/21 0323 05/28/21 0607 05/29/21 0332  WBC 9.5 6.8 6.2  NEUTROABS 7.2  --   --   HGB 11.0* 9.7* 10.2*  HCT 31.7* 29.3* 28.9*  MCV 96.1 95.4 93.8  PLT 241 203 213   Basic Metabolic Panel: Recent Labs  Lab 05/27/21 0323 05/28/21 0607 05/29/21 0332  NA 139 136 136  K 3.0* 4.0 3.6  CL 100 108 107  CO2 25 22 25   GLUCOSE 124* 95 98  BUN 11 7 7   CREATININE 0.47 0.44 0.47  CALCIUM 8.6* 8.4* 8.3*  MG 2.1 2.0 1.9   GFR: Estimated Creatinine Clearance: 94.9 mL/min (by C-G formula based on SCr of 0.47 mg/dL). Liver Function Tests: No results for input(s): AST, ALT, ALKPHOS, BILITOT, PROT, ALBUMIN in the last 168 hours. No results for input(s): LIPASE, AMYLASE in the last 168 hours. No results for input(s): AMMONIA in the last 168 hours. Coagulation Profile: No results for input(s): INR, PROTIME in the last 168 hours. Cardiac Enzymes: No results for input(s): CKTOTAL, CKMB, CKMBINDEX, TROPONINI in the last 168 hours. BNP (last 3 results) No results for input(s): PROBNP in the last 8760 hours. HbA1C: No  results for input(s): HGBA1C in the last 72 hours. CBG: No results for input(s): GLUCAP in the last 168 hours. Lipid Profile: No results for input(s): CHOL, HDL, LDLCALC, TRIG, CHOLHDL, LDLDIRECT in the last 72 hours. Thyroid Function Tests: No results for input(s): TSH, T4TOTAL, FREET4, T3FREE, THYROIDAB in the last 72 hours. Anemia Panel: No results for input(s): VITAMINB12, FOLATE, FERRITIN, TIBC, IRON, RETICCTPCT in the last 72 hours. Sepsis Labs: Recent Labs  Lab 05/27/21 0323 05/27/21 0535  LATICACIDVEN 1.3 1.1    Recent Results (from the past 240 hour(s))  Blood culture (routine x 2)     Status: None (Preliminary result)   Collection Time: 05/27/21  3:13 AM   Specimen: BLOOD  Result Value Ref Range Status   Specimen Description BLOOD RIGHT ANTECUBITAL  Final   Special Requests   Final    BOTTLES DRAWN AEROBIC AND ANAEROBIC Blood Culture adequate volume   Culture   Final    NO GROWTH 3 DAYS Performed at [facility], [address] [place], [address] [place]    Report Status PENDING  Incomplete  Blood culture (routine x 2)     Status: None (Preliminary result)   Collection Time: 05/27/21  3:19 AM   Specimen: BLOOD  Result  Value Ref Range Status   Specimen Description BLOOD BLOOD RIGHT HAND  Final   Special Requests   Final    BOTTLES DRAWN AEROBIC AND ANAEROBIC Blood Culture adequate volume   Culture   Final    NO GROWTH 3 DAYS Performed at [facility], [address] [place], [place] [address]    Report Status PENDING  Incomplete  Resp Panel by RT-PCR (Flu A&B, Covid) Nasopharyngeal Swab     Status: None   Collection Time: 05/27/21  3:45 AM   Specimen: Nasopharyngeal Swab; Nasopharyngeal(NP) swabs in vial transport medium  Result Value Ref Range Status   SARS Coronavirus 2 by RT PCR NEGATIVE NEGATIVE Final    Comment: (NOTE) SARS-CoV-2 target nucleic acids are NOT DETECTED.  The SARS-CoV-2 RNA is generally detectable in upper  respiratory specimens during the acute phase of infection. The lowest concentration of SARS-CoV-2 viral copies this assay can detect is 138 copies/mL. A negative result does not preclude SARS-Cov-2 infection and should not be used as the sole basis for treatment or other patient management decisions. A negative result may occur with  improper specimen collection/handling, submission of specimen other than nasopharyngeal swab, presence of viral mutation(s) within the areas targeted by this assay, and inadequate number of viral copies(<138 copies/mL). A negative result must be combined with clinical observations, patient history, and epidemiological information. The expected result is Negative.  Fact Sheet for Patients:  BloggerCourse.com  Fact Sheet for Healthcare Providers:  SeriousBroker.it  This test is no t yet approved or cleared by the [place] FDA and  has been authorized for detection and/or diagnosis of SARS-CoV-2 by FDA under an Emergency Use Authorization (EUA). This EUA will remain  in effect (meaning this test can be used) for the duration of the COVID-19 declaration under Section 564(b)(1) of the Act, 21 U.S.C.section 360bbb-3(b)(1), unless the authorization is terminated  or revoked sooner.       Influenza A by PCR NEGATIVE NEGATIVE Final   Influenza B by PCR NEGATIVE NEGATIVE Final    Comment: (NOTE) The Xpert Xpress SARS-CoV-2/FLU/RSV plus assay is intended as an aid in the diagnosis of influenza from Nasopharyngeal swab specimens and should not be used as a sole basis for treatment. Nasal washings and aspirates are unacceptable for Xpert Xpress SARS-CoV-2/FLU/RSV testing.  Fact Sheet for Patients: BloggerCourse.com  Fact Sheet for Healthcare Providers: SeriousBroker.it  This test is not yet approved or cleared by the [place] FDA and has been  authorized for detection and/or diagnosis of SARS-CoV-2 by FDA under an Emergency Use Authorization (EUA). This EUA will remain in effect (meaning this test can be used) for the duration of the COVID-19 declaration under Section 564(b)(1) of the Act, 21 U.S.C. section 360bbb-3(b)(1), unless the authorization is terminated or revoked.  Performed at [facility], [address] [place], [place] [address]          Radiology Studies: MR HAND LEFT W WO CONTRAST  Result Date: 05/29/2021 CLINICAL DATA:  Cat bite to the left wrist with pain and swelling. EXAM: MRI OF THE LEFT HAND WITHOUT AND WITH CONTRAST; MRI OF THE LEFT WRIST WITHOUT AND WITH CONTRAST TECHNIQUE: Multiplanar, multisequence MR imaging of the left upper extremity was performed before and after the administration of intravenous contrast. CONTRAST:  78mL GADAVIST GADOBUTROL 1 MMOL/ML IV SOLN COMPARISON:  None. FINDINGS: There is marked subcutaneous soft tissue swelling/edema/fluid involving the dorsum of the wrist and hand consistent with severe cellulitis. No discrete rim enhancing  drainable soft tissue abscess is identified. Rim enhancing tenosynovitis involving the second and third dorsal compartment wrist tendons consistent with septic tenosynovitis. This is most notably involving the extensor pollicis longus tendon but also the extensor carpi radialis longus and brevis tendons. The APL and EPB tendons are not involved. I do not see any MR findings suspicious for septic arthritis or osteomyelitis. No findings for myofasciitis or pyomyositis. IMPRESSION: 1. Severe cellulitis involving the dorsum of the wrist and hand. No discrete drainable soft tissue abscess. 2. Septic tenosynovitis involving the second and third dorsal compartment wrist tendons. 3. No findings for septic arthritis or osteomyelitis. Electronically Signed   By: Rudie Meyer M.D.   On: 05/29/2021 21:26   MR WRIST LEFT W WO CONTRAST  Result Date:  05/29/2021 CLINICAL DATA:  Cat bite to the left wrist with pain and swelling. EXAM: MRI OF THE LEFT HAND WITHOUT AND WITH CONTRAST; MRI OF THE LEFT WRIST WITHOUT AND WITH CONTRAST TECHNIQUE: Multiplanar, multisequence MR imaging of the left upper extremity was performed before and after the administration of intravenous contrast. CONTRAST:  37mL GADAVIST GADOBUTROL 1 MMOL/ML IV SOLN COMPARISON:  None. FINDINGS: There is marked subcutaneous soft tissue swelling/edema/fluid involving the dorsum of the wrist and hand consistent with severe cellulitis. No discrete rim enhancing drainable soft tissue abscess is identified. Rim enhancing tenosynovitis involving the second and third dorsal compartment wrist tendons consistent with septic tenosynovitis. This is most notably involving the extensor pollicis longus tendon but also the extensor carpi radialis longus and brevis tendons. The APL and EPB tendons are not involved. I do not see any MR findings suspicious for septic arthritis or osteomyelitis. No findings for myofasciitis or pyomyositis. IMPRESSION: 1. Severe cellulitis involving the dorsum of the wrist and hand. No discrete drainable soft tissue abscess. 2. Septic tenosynovitis involving the second and third dorsal compartment wrist tendons. 3. No findings for septic arthritis or osteomyelitis. Electronically Signed   By: Rudie Meyer M.D.   On: 05/29/2021 21:26        Scheduled Meds:  enoxaparin (LOVENOX) injection  40 mg Subcutaneous Q24H   Continuous Infusions:  ampicillin-sulbactam (UNASYN) IV 3 g (05/30/21 0909)     LOS: 3 days    Time spent: 38 minutes spent on chart review, discussion with nursing staff, consultants, updating family and interview/physical exam; more than 50% of that time was spent in counseling and/or coordination of care.    Alvira Philips [place], DO [facility] Available via Epic secure chat 7am-7pm After these hours, please refer to coverage provider listed on  [email or web address] 05/30/2021, 12:13 PM

## 2021-05-31 ENCOUNTER — Other Ambulatory Visit: Payer: Self-pay

## 2021-05-31 MED ORDER — TRAZODONE HCL 50 MG PO TABS
25.0000 mg | ORAL_TABLET | Freq: Every evening | ORAL | 0 refills | Status: AC | PRN
  Filled 2021-05-31: qty 15, 30d supply, fill #0

## 2021-05-31 MED ORDER — AMOXICILLIN-POT CLAVULANATE 875-125 MG PO TABS
1.0000 | ORAL_TABLET | Freq: Two times a day (BID) | ORAL | 0 refills | Status: AC
  Filled 2021-05-31: qty 28, 14d supply, fill #0

## 2021-05-31 NOTE — Discharge Summary (Signed)
Physician Discharge Summary  Deborah Hurley ZOX:[id number] DOB: [birth year] DOA: 05/27/2021  PCP: Oneita Hurt, No  Admit date: 05/27/2021 Discharge date: 05/31/2021  Admitted From: Home Disposition: Home  Recommendations for Outpatient Follow-up:  Follow up with PCP in 1-2 weeks Follow-up with infectious disease as scheduled on 06/11/2021 Follow-up with urgent care in [place] for scheduled rabies vaccination on 06/03/2021 and 06/10/2021 at 11 AM as scheduled Continue antibiotics with Augmentin 875-125 mg p.o. twice daily x14 days  Home Health: No Equipment/Devices: None  Discharge Condition: Stable CODE STATUS: Full code Diet recommendation: Regular diet  History of present illness:  Deborah Hurley is a 25 year old female with no previous medical history who presented to [facility] ED on 10/24 with progressive left hands/forearm pain, swelling and redness.  Patient reports that he was bit by a stray cat on Friday, 05/24/2021.  Denies any drainage from her hand or forearm, no fever/chills.   In the ED, temperature 99.1 F, BP 99/57.  Influenza A/B PCR negative.  WBC 9.5, hemoglobin 11.0, platelets 241.  Sodium 139, potassium 3.0, chloride 100, CO2 25, glucose 124, BUN 11, creatinine 0.47.  Lactic acid 1.3.  COVID-19 PCR negative.  Blood cultures drawn.  Patient was given 50 mcg IV fentanyl, potassium chloride, started on IV Unasyn and vancomycin, 50 mg of IV Toradol, and given rabies vaccination, rabies immunoglobulin and tetanus shot.  TRH consulted for further evaluation management of cellulitis secondary to cat bite.  Hospital course:  Left hand cellulitis Septic tenosynovitis Patient presenting to the ED with progressive left hand pain, swelling and erythema following stray cat bite on 05/24/2021.  Patient with low-grade fever but no leukocytosis.  Initially started on empiric antibiotics with IV Unasyn and vancomycin.  Patient received tetanus vaccination, rabies vaccination/immunoglobulin in the  ED on 10/24.  MR left hand/wrist with and without contrast notable for severe cellulitis dorsum of wrist/hand without drainable soft tissue abscess, septic tenosynovitis involving the second and third dorsal compartment wrist tendons with no findings for septic arthritis/osteomyelitis.  Orthopedics was consulted, no indication for surgical intervention given only affecting extensor tendons and the flexor tendons.  Infectious disease was also consulted and followed during hospital course.  Patient was started on Unasyn and will transition to Augmentin for an additional 14 days of treatment.  Patient has been scheduled for repeat rabies vaccination at urgent care [place] on 06/03/2021 and 06/10/2021.  Patient also with infectious disease follow-up scheduled on 06/11/2021.   Hypokalemia: Resolved Repleted during hospitalization.   Hypotension: Resolved  Discharge Diagnoses:  Active Problems:   Cellulitis of left hand   Cat bite of left hand   Tenosynovitis of left wrist    Discharge Instructions  Discharge Instructions     Call MD for:  difficulty breathing, headache or visual disturbances   Complete by: As directed    Call MD for:  extreme fatigue   Complete by: As directed    Call MD for:  persistant dizziness or light-headedness   Complete by: As directed    Call MD for:  persistant nausea and vomiting   Complete by: As directed    Call MD for:  severe uncontrolled pain   Complete by: As directed    Call MD for:  temperature >100.4   Complete by: As directed    Diet - low sodium heart healthy   Complete by: As directed    Increase activity slowly   Complete by: As directed    No wound care   Complete by: As directed  Allergies as of 05/31/2021   No Known Allergies      Medication List     TAKE these medications    amoxicillin-clavulanate 875-125 MG tablet Commonly known as: Augmentin Take 1 tablet by mouth 2 (two) times daily for 14 days.   traZODone 50 MG  tablet Commonly known as: DESYREL Take (1/2) tablet (25 mg total) by mouth once daily at bedtime as needed for sleep.        Follow-up Information     [facility] . Go on 06/03/2021.   Specialty: Urgent Care Why: 11AM for rabies vaccination Contact information: [address] [place] [address] [phone]        [facility] . Go on 06/10/2021.   Specialty: Urgent Care Why: 11AM for rabies vaccine Contact information: [address] [place] [address] [phone]        Lynn Ito, MD. Go on 06/11/2021.   Specialty: Infectious Diseases Contact information: [address] [place] [address] [phone]                No Known Allergies  Consultations: Infectious disease Orthopedics, Dr. Signa Kell   Procedures/Studies: MR HAND LEFT W WO CONTRAST  Result Date: 05/29/2021 CLINICAL DATA:  Cat bite to the left wrist with pain and swelling. EXAM: MRI OF THE LEFT HAND WITHOUT AND WITH CONTRAST; MRI OF THE LEFT WRIST WITHOUT AND WITH CONTRAST TECHNIQUE: Multiplanar, multisequence MR imaging of the left upper extremity was performed before and after the administration of intravenous contrast. CONTRAST:  7mL GADAVIST GADOBUTROL 1 MMOL/ML IV SOLN COMPARISON:  None. FINDINGS: There is marked subcutaneous soft tissue swelling/edema/fluid involving the dorsum of the wrist and hand consistent with severe cellulitis. No discrete rim enhancing drainable soft tissue abscess is identified. Rim enhancing tenosynovitis involving the second and third dorsal compartment wrist tendons consistent with septic tenosynovitis. This is most notably involving the extensor pollicis longus tendon but also the extensor carpi radialis longus and brevis tendons. The APL and EPB tendons are not involved. I do not see any MR findings suspicious for septic arthritis or  osteomyelitis. No findings for myofasciitis or pyomyositis. IMPRESSION: 1. Severe cellulitis involving the dorsum of the wrist and hand. No discrete drainable soft tissue abscess. 2. Septic tenosynovitis involving the second and third dorsal compartment wrist tendons. 3. No findings for septic arthritis or osteomyelitis. Electronically Signed   By: Rudie Meyer M.D.   On: 05/29/2021 21:26   MR WRIST LEFT W WO CONTRAST  Result Date: 05/29/2021 CLINICAL DATA:  Cat bite to the left wrist with pain and swelling. EXAM: MRI OF THE LEFT HAND WITHOUT AND WITH CONTRAST; MRI OF THE LEFT WRIST WITHOUT AND WITH CONTRAST TECHNIQUE: Multiplanar, multisequence MR imaging of the left upper extremity was performed before and after the administration of intravenous contrast. CONTRAST:  7mL GADAVIST GADOBUTROL 1 MMOL/ML IV SOLN COMPARISON:  None. FINDINGS: There is marked subcutaneous soft tissue swelling/edema/fluid involving the dorsum of the wrist and hand consistent with severe cellulitis. No discrete rim enhancing drainable soft tissue abscess is identified. Rim enhancing tenosynovitis involving the second and third dorsal compartment wrist tendons consistent with septic tenosynovitis. This is most notably involving the extensor pollicis longus tendon but also the extensor carpi radialis longus and brevis tendons. The APL and EPB tendons are not involved. I do not see any MR findings suspicious for septic arthritis or osteomyelitis. No findings for myofasciitis or  pyomyositis. IMPRESSION: 1. Severe cellulitis involving the dorsum of the wrist and hand. No discrete drainable soft tissue abscess. 2. Septic tenosynovitis involving the second and third dorsal compartment wrist tendons. 3. No findings for septic arthritis or osteomyelitis. Electronically Signed   By: Rudie Meyer M.D.   On: 05/29/2021 21:26     Subjective: Patient seen examined bedside, resting comfortably.  No complaints this morning.  Ready for discharge  home.  Discussed with ID and orthopedics with plan for 14 days of additional oral antibiotics on discharge.  Patient has scheduled follow-up at urgent care for continued rabies vaccination and outpatient follow-up scheduled with infectious disease.  No other questions or concerns at this time.  Denies headache, no fever/chills/night sweats, no nausea/vomiting/diarrhea, no chest pain, no palpitations, no abdominal pain, no weakness, no fatigue, no paresthesias.  No acute events overnight per nursing staff.  Discharge Exam: Vitals:   05/31/21 0544 05/31/21 0754  BP: (!) 111/54 (!) 96/58  Pulse: 74 (!) 59  Resp: 16 16  Temp: 98.4 F (36.9 C) 97.6 F (36.4 C)  SpO2: 99% 100%   Vitals:   05/30/21 1749 05/30/21 2103 05/31/21 0544 05/31/21 0754  BP: 128/70 104/60 (!) 111/54 (!) 96/58  Pulse: 68 72 74 (!) 59  Resp: 18 16 16 16   Temp: 98.4 F (36.9 C) 98 F (36.7 C) 98.4 F (36.9 C) 97.6 F (36.4 C)  TempSrc: Oral  Oral Oral  SpO2: 99% 100% 99% 100%  Weight:      Height:        General: Pt is alert, awake, not in acute distress Cardiovascular: RRR, S1/S2 +, no rubs, no gallops Respiratory: CTA bilaterally, no wheezing, no rhonchi, on room air Abdominal: Soft, NT, ND, bowel sounds + Extremities: Left hand with edema to dorsal surface, erythema now resolved, no cyanosis    The results of significant diagnostics from this hospitalization (including imaging, microbiology, ancillary and laboratory) are listed below for reference.     Microbiology: Recent Results (from the past 240 hour(s))  Blood culture (routine x 2)     Status: None (Preliminary result)   Collection Time: 05/27/21  3:13 AM   Specimen: BLOOD  Result Value Ref Range Status   Specimen Description BLOOD RIGHT ANTECUBITAL  Final   Special Requests   Final    BOTTLES DRAWN AEROBIC AND ANAEROBIC Blood Culture adequate volume   Culture   Final    NO GROWTH 4 DAYS Performed at [facility], [address] [place], [place] [address]    Report Status PENDING  Incomplete  Blood culture (routine x 2)     Status: None (Preliminary result)   Collection Time: 05/27/21  3:19 AM   Specimen: BLOOD  Result Value Ref Range Status   Specimen Description BLOOD BLOOD RIGHT HAND  Final   Special Requests   Final    BOTTLES DRAWN AEROBIC AND ANAEROBIC Blood Culture adequate volume   Culture   Final    NO GROWTH 4 DAYS Performed at [facility], [address], [place], [place] [address]    Report Status PENDING  Incomplete  Resp Panel by RT-PCR (Flu A&B, Covid) Nasopharyngeal Swab     Status: None   Collection Time: 05/27/21  3:45 AM   Specimen: Nasopharyngeal Swab; Nasopharyngeal(NP) swabs in vial transport medium  Result Value Ref Range Status   SARS Coronavirus 2 by RT PCR NEGATIVE NEGATIVE Final    Comment: (NOTE) SARS-CoV-2 target nucleic acids are NOT  DETECTED.  The SARS-CoV-2 RNA is generally detectable in upper respiratory specimens during the acute phase of infection. The lowest concentration of SARS-CoV-2 viral copies this assay can detect is 138 copies/mL. A negative result does not preclude SARS-Cov-2 infection and should not be used as the sole basis for treatment or other patient management decisions. A negative result may occur with  improper specimen collection/handling, submission of specimen other than nasopharyngeal swab, presence of viral mutation(s) within the areas targeted by this assay, and inadequate number of viral copies(<138 copies/mL). A negative result must be combined with clinical observations, patient history, and epidemiological information. The expected result is Negative.  Fact Sheet for Patients:  BloggerCourse.com  Fact Sheet for Healthcare Providers:  SeriousBroker.it  This test is no t yet approved or cleared by the [place] FDA and  has been authorized for detection and/or diagnosis of  SARS-CoV-2 by FDA under an Emergency Use Authorization (EUA). This EUA will remain  in effect (meaning this test can be used) for the duration of the COVID-19 declaration under Section 564(b)(1) of the Act, 21 U.S.C.section 360bbb-3(b)(1), unless the authorization is terminated  or revoked sooner.       Influenza A by PCR NEGATIVE NEGATIVE Final   Influenza B by PCR NEGATIVE NEGATIVE Final    Comment: (NOTE) The Xpert Xpress SARS-CoV-2/FLU/RSV plus assay is intended as an aid in the diagnosis of influenza from Nasopharyngeal swab specimens and should not be used as a sole basis for treatment. Nasal washings and aspirates are unacceptable for Xpert Xpress SARS-CoV-2/FLU/RSV testing.  Fact Sheet for Patients: BloggerCourse.com  Fact Sheet for Healthcare Providers: SeriousBroker.it  This test is not yet approved or cleared by the [place] FDA and has been authorized for detection and/or diagnosis of SARS-CoV-2 by FDA under an Emergency Use Authorization (EUA). This EUA will remain in effect (meaning this test can be used) for the duration of the COVID-19 declaration under Section 564(b)(1) of the Act, 21 U.S.C. section 360bbb-3(b)(1), unless the authorization is terminated or revoked.  Performed at [facility], [address] [place], [place] [address]      Labs: BNP (last 3 results) No results for input(s): BNP in the last 8760 hours. Basic Metabolic Panel: Recent Labs  Lab 05/27/21 0323 05/28/21 0607 05/29/21 0332  NA 139 136 136  K 3.0* 4.0 3.6  CL 100 108 107  CO2 25 22 25   GLUCOSE 124* 95 98  BUN 11 7 7   CREATININE 0.47 0.44 0.47  CALCIUM 8.6* 8.4* 8.3*  MG 2.1 2.0 1.9   Liver Function Tests: No results for input(s): AST, ALT, ALKPHOS, BILITOT, PROT, ALBUMIN in the last 168 hours. No results for input(s): LIPASE, AMYLASE in the last 168 hours. No results for input(s): AMMONIA in the last 168  hours. CBC: Recent Labs  Lab 05/27/21 0323 05/28/21 0607 05/29/21 0332  WBC 9.5 6.8 6.2  NEUTROABS 7.2  --   --   HGB 11.0* 9.7* 10.2*  HCT 31.7* 29.3* 28.9*  MCV 96.1 95.4 93.8  PLT 241 203 213   Cardiac Enzymes: No results for input(s): CKTOTAL, CKMB, CKMBINDEX, TROPONINI in the last 168 hours. BNP: Invalid input(s): POCBNP CBG: No results for input(s): GLUCAP in the last 168 hours. D-Dimer No results for input(s): DDIMER in the last 72 hours. Hgb A1c No results for input(s): HGBA1C in the last 72 hours. Lipid Profile No results for input(s): CHOL, HDL, LDLCALC, TRIG, CHOLHDL, LDLDIRECT in the last 72 hours. Thyroid function  studies No results for input(s): TSH, T4TOTAL, T3FREE, THYROIDAB in the last 72 hours.  Invalid input(s): FREET3 Anemia work up No results for input(s): VITAMINB12, FOLATE, FERRITIN, TIBC, IRON, RETICCTPCT in the last 72 hours. Urinalysis No results found for: COLORURINE, APPEARANCEUR, LABSPEC, PHURINE, GLUCOSEU, HGBUR, BILIRUBINUR, KETONESUR, PROTEINUR, UROBILINOGEN, NITRITE, LEUKOCYTESUR Sepsis Labs Invalid input(s): PROCALCITONIN,  WBC,  LACTICIDVEN Microbiology Recent Results (from the past 240 hour(s))  Blood culture (routine x 2)     Status: None (Preliminary result)   Collection Time: 05/27/21  3:13 AM   Specimen: BLOOD  Result Value Ref Range Status   Specimen Description BLOOD RIGHT ANTECUBITAL  Final   Special Requests   Final    BOTTLES DRAWN AEROBIC AND ANAEROBIC Blood Culture adequate volume   Culture   Final    NO GROWTH 4 DAYS Performed at [facility], [address] [place], [place] [address]    Report Status PENDING  Incomplete  Blood culture (routine x 2)     Status: None (Preliminary result)   Collection Time: 05/27/21  3:19 AM   Specimen: BLOOD  Result Value Ref Range Status   Specimen Description BLOOD BLOOD RIGHT HAND  Final   Special Requests   Final    BOTTLES DRAWN AEROBIC AND ANAEROBIC Blood Culture  adequate volume   Culture   Final    NO GROWTH 4 DAYS Performed at [facility], [address], [place], [place] [address]    Report Status PENDING  Incomplete  Resp Panel by RT-PCR (Flu A&B, Covid) Nasopharyngeal Swab     Status: None   Collection Time: 05/27/21  3:45 AM   Specimen: Nasopharyngeal Swab; Nasopharyngeal(NP) swabs in vial transport medium  Result Value Ref Range Status   SARS Coronavirus 2 by RT PCR NEGATIVE NEGATIVE Final    Comment: (NOTE) SARS-CoV-2 target nucleic acids are NOT DETECTED.  The SARS-CoV-2 RNA is generally detectable in upper respiratory specimens during the acute phase of infection. The lowest concentration of SARS-CoV-2 viral copies this assay can detect is 138 copies/mL. A negative result does not preclude SARS-Cov-2 infection and should not be used as the sole basis for treatment or other patient management decisions. A negative result may occur with  improper specimen collection/handling, submission of specimen other than nasopharyngeal swab, presence of viral mutation(s) within the areas targeted by this assay, and inadequate number of viral copies(<138 copies/mL). A negative result must be combined with clinical observations, patient history, and epidemiological information. The expected result is Negative.  Fact Sheet for Patients:  BloggerCourse.com  Fact Sheet for Healthcare Providers:  SeriousBroker.it  This test is no t yet approved or cleared by the [place] FDA and  has been authorized for detection and/or diagnosis of SARS-CoV-2 by FDA under an Emergency Use Authorization (EUA). This EUA will remain  in effect (meaning this test can be used) for the duration of the COVID-19 declaration under Section 564(b)(1) of the Act, 21 U.S.C.section 360bbb-3(b)(1), unless the authorization is terminated  or revoked sooner.       Influenza A by PCR NEGATIVE NEGATIVE Final    Influenza B by PCR NEGATIVE NEGATIVE Final    Comment: (NOTE) The Xpert Xpress SARS-CoV-2/FLU/RSV plus assay is intended as an aid in the diagnosis of influenza from Nasopharyngeal swab specimens and should not be used as a sole basis for treatment. Nasal washings and aspirates are unacceptable for Xpert Xpress SARS-CoV-2/FLU/RSV testing.  Fact Sheet for Patients: BloggerCourse.com  Fact Sheet for Healthcare Providers:  SeriousBroker.it  This test is not yet approved or cleared by the [place] and has been authorized for detection and/or diagnosis of SARS-CoV-2 by FDA under an Emergency Use Authorization (EUA). This EUA will remain in effect (meaning this test can be used) for the duration of the COVID-19 declaration under Section 564(b)(1) of the Act, 21 U.S.C. section 360bbb-3(b)(1), unless the authorization is terminated or revoked.  Performed at [facility], [address] [place], [place] [address]      Time coordinating discharge: Over 30 minutes  SIGNED:   Alvira Philips [place], DO  [facility] 05/31/2021, 10:20 AM

## 2021-05-31 NOTE — Progress Notes (Signed)
ID Pt doing better Pain and swelling better But she is not moving her fingers actively  Patient Vitals for the past 24 hrs:  BP Temp Temp src Pulse Resp SpO2  05/31/21 1119 104/64 -- -- 83 -- 100 %  05/31/21 1100 104/64 98.4 F (36.9 C) Oral 84 16 100 %  05/31/21 0754 (!) 96/58 97.6 F (36.4 C) Oral (!) 59 16 100 %  05/31/21 0544 (!) 111/54 98.4 F (36.9 C) Oral 74 16 99 %  05/30/21 2103 104/60 98 F (36.7 C) -- 72 16 100 %  05/30/21 1749 128/70 98.4 F (36.9 C) Oral 68 18 99 %    Left arm- dorsum of the hand less puffy No erythema Restricted wrister and finger movt due to pain   Labs CBC Latest Ref Rng & Units 05/29/2021 05/28/2021 05/27/2021  WBC 4.0 - 10.5 K/uL 6.2 6.8 9.5  Hemoglobin 12.0 - 15.0 g/dL 10.2(L) 9.7(L) 11.0(L)  Hematocrit 36.0 - 46.0 % 28.9(L) 29.3(L) 31.7(L)  Platelets 150 - 400 K/uL 213 203 241     CMP Latest Ref Rng & Units 05/29/2021 05/28/2021 05/27/2021  Glucose 70 - 99 mg/dL 98 95 707(E)  BUN 6 - 20 mg/dL 7 7 11   Creatinine 0.44 - 1.00 mg/dL 6.75 4.49  Sodium 135 - 145 mmol/L 136 136 139  Potassium 3.5 - 5.1 mmol/L 3.6 4.0 3.0(L)  Chloride 98 - 111 mmol/L 107 108 100  CO2 22 - 32 mmol/L 25 22 25   Calcium 8.9 - 10.3 mg/dL 8.3(L) 8.4(L) 8.6(L)     Impression/recommendation Cat bite cellulitis - much improved- on unasyn Septc tenosynovitis- improving- she will need 2 more weeks of Augmentin  Discussed the management with patient Will follow as Op

## 2021-05-31 NOTE — TOC Progression Note (Signed)
Transition of Care [facility]) - Progression Note    Patient Details  Name: Deborah Hurley MRN: [id number] Date of Birth: 09-Jul-1996  [facility] CM/SW Contact  Caryn Section, RN Phone Number: 05/31/2021, 11:06 AM  Clinical Narrative:   Anticipated discharge home today.  Patient agrees to choose a clinic and follow up with PCP, and to acknowledge and attend appointment for rabies vaccines.  Patient has transportation to medication management, and will pick up medications there at discharge.        Barriers to Discharge: Continued Medical Work up  Expected Discharge Plan and Services     Discharge Planning Services: CM Consult   Living arrangements for the past 2 months: Single Family Home Expected Discharge Date: 05/31/21                                     Social Determinants of Health (SDOH) Interventions    Readmission Risk Interventions No flowsheet data found.

## 2021-05-31 NOTE — Progress Notes (Signed)
Pt being discharged home, discharge instructions reviewed with pt, states understanding, pt with no complaints, awaiting ride for transport 

## 2021-06-01 LAB — CULTURE, BLOOD (ROUTINE X 2)
Culture: NO GROWTH
Culture: NO GROWTH
Special Requests: ADEQUATE
Special Requests: ADEQUATE

## 2021-06-03 ENCOUNTER — Other Ambulatory Visit: Payer: Self-pay

## 2021-06-03 ENCOUNTER — Ambulatory Visit: Payer: Self-pay

## 2021-06-03 ENCOUNTER — Emergency Department
Admission: EM | Admit: 2021-06-03 | Discharge: 2021-06-03 | Disposition: A | Discharge status: Home / Self Care | Payer: Self-pay | Attending: Emergency Medicine | Admitting: Emergency Medicine

## 2021-06-03 ENCOUNTER — Encounter: Payer: Self-pay | Admitting: Emergency Medicine

## 2021-06-03 DIAGNOSIS — Z203 Contact with and (suspected) exposure to rabies: Secondary | ICD-10-CM | POA: Insufficient documentation

## 2021-06-03 DIAGNOSIS — Z23 Encounter for immunization: Secondary | ICD-10-CM | POA: Insufficient documentation

## 2021-06-03 MED ORDER — RABIES VACCINE, PCEC IM SUSR
1.0000 mL | Freq: Once | INTRAMUSCULAR | Status: AC
  Administered 2021-06-03: 1 mL via INTRAMUSCULAR
  Filled 2021-06-03: qty 1

## 2021-06-03 NOTE — ED Notes (Signed)
Site prepared with alcohol wipe. Bandaid applied afterwards. Patient tolerated the injection well.

## 2021-06-03 NOTE — ED Triage Notes (Signed)
Presents for 2nd rabies shot

## 2021-06-10 ENCOUNTER — Ambulatory Visit: Payer: Self-pay

## 2021-06-11 ENCOUNTER — Inpatient Hospital Stay: Payer: Self-pay | Admitting: Infectious Diseases

## 2021-06-14 NOTE — ED Provider Notes (Signed)
[facility] Emergency Department Provider Note  ____________________________________________  Time seen: Approximately 2:13 PM  I have reviewed the triage vital signs and the nursing notes.   HISTORY  Chief Complaint Rabies Injection   HPI Deborah Hurley is a 25 y.o. female presents to the emergency department for second rabies vaccination.  She was evaluated here after being bitten by a cat.  She has taken antibiotics as prescribed.  She denies complaints today.  History reviewed. No pertinent past medical history.  Patient Active Problem List   Diagnosis Date Noted   Tenosynovitis of left wrist 05/30/2021   Cat bite of left hand    Cellulitis of left hand 05/27/2021    History reviewed. No pertinent surgical history.  Prior to Admission medications   Medication Sig Start Date End Date Taking? Authorizing Provider  amoxicillin-clavulanate (AUGMENTIN) 875-125 MG tablet Take 1 tablet by mouth 2 (two) times daily for 14 days. 05/31/21 06/14/21  [place], Alvira Philips, DO  traZODone (DESYREL) 50 MG tablet Take (1/2) tablet (25 mg total) by mouth once daily at bedtime as needed for sleep. 05/31/21 06/30/21  [place], Eric J, DO    Allergies Patient has no known allergies.  No family history on file.  Social History Social History   Tobacco Use   Smoking status: Never   Smokeless tobacco: Never    Review of Systems Constitutional: No fever/chills Eyes: No visual changes. Respiratory: Denies shortness of breath. Musculoskeletal: Negative for pain. Skin: Negative for concern of infection. Neurological: Negative for headaches, focal weakness or numbness. ____________________________________________   PHYSICAL EXAM:  VITAL SIGNS: ED Triage Vitals  Enc Vitals Group     BP 06/03/21 1132 104/63     Pulse Rate 06/03/21 1132 64     Resp 06/03/21 1132 20     Temp 06/03/21 1132 98.4 F (36.9 C)     Temp Source 06/03/21 1132 Oral     SpO2 06/03/21  1132 99 %     Weight 06/03/21 1043 150 lb (68 kg)     Height 06/03/21 1043 5\' 1"  (1.549 m)     Head Circumference --      Peak Flow --      Pain Score 06/03/21 1043 0     Pain Loc --      Pain Edu? --      Excl. in GC? --     Constitutional: Alert and oriented. Well appearing and in no acute distress. Eyes: Conjunctivae are normal. PERRL. EOMI. Head: Atraumatic. Nose: No congestion/rhinnorhea. Mouth/Throat: Mucous membranes are moist.  Oropharynx non-erythematous. Neck: No stridor.  Cardiovascular: Normal rate, regular rhythm.  Respiratory: Normal respiratory effort.  No retractions.. Gastrointestinal: Soft and nontender. No distention Musculoskeletal: No lower extremity tenderness nor edema.  No joint effusions. Neurologic:  Normal speech and language. No gross focal neurologic deficits are appreciated. Speech is normal. No gait instability. Skin:  Skin is warm, dry and intact.  Psychiatric: Mood and affect are normal. Speech and behavior are normal.  ____________________________________________   LABS (all labs ordered are listed, but only abnormal results are displayed)  Labs Reviewed - No data to display ____________________________________________   RADIOLOGY  Not indicated ____________________________________________   PROCEDURES  Procedure(s) performed: None  ____________________________________________   INITIAL IMPRESSION / ASSESSMENT AND PLAN / ED COURSE  Patient advised to return for the remainder of the rabies series as advised on the initial exam.  Pertinent labs & imaging results that were available during my care of the patient  were reviewed by me and considered in my medical decision making (see chart for details).   ____________________________________________   FINAL CLINICAL IMPRESSION(S) / ED DIAGNOSES  Final diagnoses:  Need for prophylactic vaccination against rabies    Note:  This document was prepared using Dragon voice recognition  software and may include unintentional dictation errors.    Chinita Pester, FNP 06/14/21 1415    Minna Antis, MD 06/14/21 2102

## 2021-08-27 ENCOUNTER — Telehealth: Payer: Self-pay | Admitting: Pharmacy Technician

## 2021-08-27 NOTE — Telephone Encounter (Signed)
Patient failed to provide 2023 proof of income.  No additional medication assistance will be provided by [facility] without the required proof of income documentation.  Patient notified by letter.  Deborah Hurley, [place]  [id number]  August 27, 2021    [name] [id number] [place], [place]  [address]  Dear Deborah Hurley:  This is to inform you that you are no longer eligible to receive medication assistance at [facility].  The reason(s) are:    _____Your total gross monthly household income exceeds 300% of the Federal Poverty Level.   _____Tangible assets (savings, checking, stocks/bonds, pension, retirement, etc.) exceeds our limit  _____You are eligible to receive benefits from [facility], [facility] or HIV Medication              Assistance Program _____You are eligible to receive benefits from a Medicare Part D plan _____You have prescription insurance  _____You are not an [facility] resident __X__Failure to provide all requested documentation (proof of income for 2023, and/or Patient Intake Application, DOH Attestation, Contract, etc).    Medication assistance will resume once all requested documentation has been returned to our clinic.  If you have questions, please contact our clinic at [phone].    Thank you,  [facility]

## 2021-09-11 ENCOUNTER — Other Ambulatory Visit: Payer: Self-pay
